# Patient Record
Sex: Male | Born: 1940 | Race: White | Hispanic: No | Marital: Married | Smoking: Former smoker
Health system: Southern US, Community
[De-identification: ages and names within clinical notes are randomized; demographics above are authoritative.]

## PROBLEM LIST (undated history)

## (undated) DIAGNOSIS — N401 Enlarged prostate with lower urinary tract symptoms: Secondary | ICD-10-CM

## (undated) DIAGNOSIS — N138 Other obstructive and reflux uropathy: Secondary | ICD-10-CM

## (undated) DIAGNOSIS — C801 Malignant (primary) neoplasm, unspecified: Secondary | ICD-10-CM

## (undated) DIAGNOSIS — C44319 Basal cell carcinoma of skin of other parts of face: Secondary | ICD-10-CM

## (undated) DIAGNOSIS — C8590 Non-Hodgkin lymphoma, unspecified, unspecified site: Secondary | ICD-10-CM

## (undated) DIAGNOSIS — E7800 Pure hypercholesterolemia, unspecified: Secondary | ICD-10-CM

## (undated) DIAGNOSIS — D126 Benign neoplasm of colon, unspecified: Secondary | ICD-10-CM

## (undated) DIAGNOSIS — J309 Allergic rhinitis, unspecified: Secondary | ICD-10-CM

## (undated) DIAGNOSIS — D171 Benign lipomatous neoplasm of skin and subcutaneous tissue of trunk: Secondary | ICD-10-CM

## (undated) DIAGNOSIS — G4733 Obstructive sleep apnea (adult) (pediatric): Secondary | ICD-10-CM

## (undated) DIAGNOSIS — D72820 Lymphocytosis (symptomatic): Secondary | ICD-10-CM

## (undated) HISTORY — DX: Pure hypercholesterolemia, unspecified: E78.00

## (undated) HISTORY — PX: OTHER SURGICAL HISTORY: SHX169

## (undated) HISTORY — DX: Obstructive sleep apnea (adult) (pediatric): G47.33

## (undated) HISTORY — DX: Benign prostatic hyperplasia with lower urinary tract symptoms: N40.1

## (undated) HISTORY — PX: SKIN SURGERY: SHX2413

## (undated) HISTORY — PX: HERNIA REPAIR: SHX51

## (undated) HISTORY — DX: Malignant (primary) neoplasm, unspecified: C80.1

## (undated) HISTORY — DX: Benign lipomatous neoplasm of skin and subcutaneous tissue of trunk: D17.1

## (undated) HISTORY — DX: Benign neoplasm of colon, unspecified: D12.6

## (undated) HISTORY — DX: Lymphocytosis (symptomatic): D72.820

## (undated) HISTORY — PX: HEMORRHOID SURGERY: SHX153

## (undated) HISTORY — DX: Basal cell carcinoma of skin of other parts of face: C44.319

## (undated) HISTORY — DX: Allergic rhinitis, unspecified: J30.9

## (undated) HISTORY — DX: Other obstructive and reflux uropathy: N13.8

---

## 1993-04-06 DIAGNOSIS — C801 Malignant (primary) neoplasm, unspecified: Secondary | ICD-10-CM

## 1993-04-06 HISTORY — DX: Malignant (primary) neoplasm, unspecified: C80.1

## 2000-09-08 ENCOUNTER — Ambulatory Visit (HOSPITAL_COMMUNITY): Admission: RE | Admit: 2000-09-08 | Discharge: 2000-09-08 | Payer: Self-pay | Admitting: Gastroenterology

## 2005-04-10 ENCOUNTER — Encounter: Admission: RE | Admit: 2005-04-10 | Discharge: 2005-04-10 | Payer: Self-pay | Admitting: General Surgery

## 2010-09-30 ENCOUNTER — Ambulatory Visit
Admission: RE | Admit: 2010-09-30 | Discharge: 2010-09-30 | Disposition: A | Discharge status: Home / Self Care | Payer: Medicare Other | Source: Ambulatory Visit | Attending: General Surgery | Admitting: General Surgery

## 2010-09-30 ENCOUNTER — Other Ambulatory Visit (INDEPENDENT_AMBULATORY_CARE_PROVIDER_SITE_OTHER): Payer: Self-pay | Admitting: General Surgery

## 2010-09-30 DIAGNOSIS — Z01811 Encounter for preprocedural respiratory examination: Secondary | ICD-10-CM

## 2010-10-03 DIAGNOSIS — K649 Unspecified hemorrhoids: Secondary | ICD-10-CM

## 2010-10-13 ENCOUNTER — Encounter (INDEPENDENT_AMBULATORY_CARE_PROVIDER_SITE_OTHER): Payer: Self-pay | Admitting: General Surgery

## 2010-10-15 ENCOUNTER — Encounter (INDEPENDENT_AMBULATORY_CARE_PROVIDER_SITE_OTHER): Payer: Self-pay | Admitting: General Surgery

## 2010-10-15 ENCOUNTER — Ambulatory Visit (INDEPENDENT_AMBULATORY_CARE_PROVIDER_SITE_OTHER): Payer: Medicare Other | Admitting: General Surgery

## 2010-10-15 DIAGNOSIS — K648 Other hemorrhoids: Secondary | ICD-10-CM | POA: Insufficient documentation

## 2010-10-15 NOTE — Progress Notes (Signed)
He is here for his first postoperative visit after hemorrhoidectomy. He is doing fairly well now. Does have some drainage. Bowels are moving well.  PE: Wound is open in the perianal area but it is clean.  Assessment: Doing well after hemorrhoidectomy.  Plan continue warm water soaks. Activities as tolerated. See him back in 6 weeks.

## 2011-03-19 ENCOUNTER — Encounter (INDEPENDENT_AMBULATORY_CARE_PROVIDER_SITE_OTHER): Payer: Self-pay | Admitting: General Surgery

## 2011-04-07 DIAGNOSIS — D126 Benign neoplasm of colon, unspecified: Secondary | ICD-10-CM

## 2011-04-07 HISTORY — DX: Benign neoplasm of colon, unspecified: D12.6

## 2011-04-15 ENCOUNTER — Encounter (INDEPENDENT_AMBULATORY_CARE_PROVIDER_SITE_OTHER): Payer: Self-pay | Admitting: General Surgery

## 2011-04-15 ENCOUNTER — Ambulatory Visit (INDEPENDENT_AMBULATORY_CARE_PROVIDER_SITE_OTHER): Payer: Medicare Other | Admitting: General Surgery

## 2011-04-15 VITALS — BP 144/86 | HR 67 | Temp 98.8°F | Ht 70.0 in | Wt 205.2 lb

## 2011-04-15 DIAGNOSIS — Z09 Encounter for follow-up examination after completed treatment for conditions other than malignant neoplasm: Secondary | ICD-10-CM | POA: Diagnosis not present

## 2011-04-15 DIAGNOSIS — K648 Other hemorrhoids: Secondary | ICD-10-CM | POA: Diagnosis not present

## 2011-04-15 NOTE — Patient Instructions (Signed)
Eat 30 grams of fiber per day.  Avoid constipation.

## 2011-04-15 NOTE — Progress Notes (Signed)
Kyle Sawyer is here for long-term follow up after his internal hemorrhoidectomy in June of 2012. He states she's had no problems with constipation or bleeding.  No anal pain.    Physical exam-anal rectal: External skin tags are noted, no fissures, no masses or blood on digital rectal exam.  Anoscopy-no hemorrhoidal disease right anterior area. Small internal hemorrhoids in the right posterior and left lateral position.  Assessment: Satisfactory result following right anterior internal hemorrhoidectomy for prolapsing internal hemorrhoid.  Plan: He can proceed with his colonoscopy. We discussed increasing the fiber in his diet and avoiding constipation. Return visit p.r.n.

## 2011-05-05 DIAGNOSIS — Z1211 Encounter for screening for malignant neoplasm of colon: Secondary | ICD-10-CM | POA: Diagnosis not present

## 2011-05-05 DIAGNOSIS — D126 Benign neoplasm of colon, unspecified: Secondary | ICD-10-CM | POA: Diagnosis not present

## 2011-07-24 DIAGNOSIS — H6120 Impacted cerumen, unspecified ear: Secondary | ICD-10-CM | POA: Diagnosis not present

## 2011-07-24 DIAGNOSIS — E7800 Pure hypercholesterolemia, unspecified: Secondary | ICD-10-CM | POA: Diagnosis not present

## 2011-07-24 DIAGNOSIS — Z1331 Encounter for screening for depression: Secondary | ICD-10-CM | POA: Diagnosis not present

## 2011-07-24 DIAGNOSIS — Z79899 Other long term (current) drug therapy: Secondary | ICD-10-CM | POA: Diagnosis not present

## 2011-07-24 DIAGNOSIS — Z0000 Encounter for general adult medical examination without abnormal findings: Secondary | ICD-10-CM | POA: Diagnosis not present

## 2011-12-31 DIAGNOSIS — Z23 Encounter for immunization: Secondary | ICD-10-CM | POA: Diagnosis not present

## 2012-01-18 DIAGNOSIS — Z85828 Personal history of other malignant neoplasm of skin: Secondary | ICD-10-CM | POA: Diagnosis not present

## 2012-01-18 DIAGNOSIS — L570 Actinic keratosis: Secondary | ICD-10-CM | POA: Diagnosis not present

## 2012-01-18 DIAGNOSIS — L821 Other seborrheic keratosis: Secondary | ICD-10-CM | POA: Diagnosis not present

## 2012-01-18 DIAGNOSIS — D239 Other benign neoplasm of skin, unspecified: Secondary | ICD-10-CM | POA: Diagnosis not present

## 2012-01-18 DIAGNOSIS — D485 Neoplasm of uncertain behavior of skin: Secondary | ICD-10-CM | POA: Diagnosis not present

## 2012-01-18 DIAGNOSIS — Z85820 Personal history of malignant melanoma of skin: Secondary | ICD-10-CM | POA: Diagnosis not present

## 2012-01-18 DIAGNOSIS — C44390 Other specified malignant neoplasm of skin of unspecified parts of face: Secondary | ICD-10-CM | POA: Diagnosis not present

## 2012-02-02 DIAGNOSIS — Z79899 Other long term (current) drug therapy: Secondary | ICD-10-CM | POA: Diagnosis not present

## 2012-02-15 DIAGNOSIS — C44319 Basal cell carcinoma of skin of other parts of face: Secondary | ICD-10-CM | POA: Diagnosis not present

## 2012-05-10 DIAGNOSIS — L570 Actinic keratosis: Secondary | ICD-10-CM | POA: Diagnosis not present

## 2012-07-13 DIAGNOSIS — H2510 Age-related nuclear cataract, unspecified eye: Secondary | ICD-10-CM | POA: Diagnosis not present

## 2012-07-26 DIAGNOSIS — Z0000 Encounter for general adult medical examination without abnormal findings: Secondary | ICD-10-CM | POA: Diagnosis not present

## 2012-07-26 DIAGNOSIS — Z1331 Encounter for screening for depression: Secondary | ICD-10-CM | POA: Diagnosis not present

## 2012-07-26 DIAGNOSIS — Z79899 Other long term (current) drug therapy: Secondary | ICD-10-CM | POA: Diagnosis not present

## 2012-07-26 DIAGNOSIS — R030 Elevated blood-pressure reading, without diagnosis of hypertension: Secondary | ICD-10-CM | POA: Diagnosis not present

## 2012-07-26 DIAGNOSIS — E7800 Pure hypercholesterolemia, unspecified: Secondary | ICD-10-CM | POA: Diagnosis not present

## 2012-11-14 DIAGNOSIS — R030 Elevated blood-pressure reading, without diagnosis of hypertension: Secondary | ICD-10-CM | POA: Diagnosis not present

## 2012-11-14 DIAGNOSIS — J301 Allergic rhinitis due to pollen: Secondary | ICD-10-CM | POA: Diagnosis not present

## 2012-12-14 DIAGNOSIS — Z23 Encounter for immunization: Secondary | ICD-10-CM | POA: Diagnosis not present

## 2013-01-23 DIAGNOSIS — Z79899 Other long term (current) drug therapy: Secondary | ICD-10-CM | POA: Diagnosis not present

## 2013-01-31 DIAGNOSIS — L821 Other seborrheic keratosis: Secondary | ICD-10-CM | POA: Diagnosis not present

## 2013-01-31 DIAGNOSIS — Z85820 Personal history of malignant melanoma of skin: Secondary | ICD-10-CM | POA: Diagnosis not present

## 2013-01-31 DIAGNOSIS — L570 Actinic keratosis: Secondary | ICD-10-CM | POA: Diagnosis not present

## 2013-01-31 DIAGNOSIS — D239 Other benign neoplasm of skin, unspecified: Secondary | ICD-10-CM | POA: Diagnosis not present

## 2013-01-31 DIAGNOSIS — Z85828 Personal history of other malignant neoplasm of skin: Secondary | ICD-10-CM | POA: Diagnosis not present

## 2013-02-13 DIAGNOSIS — D696 Thrombocytopenia, unspecified: Secondary | ICD-10-CM | POA: Diagnosis not present

## 2013-02-13 DIAGNOSIS — Z79899 Other long term (current) drug therapy: Secondary | ICD-10-CM | POA: Diagnosis not present

## 2013-03-20 DIAGNOSIS — J40 Bronchitis, not specified as acute or chronic: Secondary | ICD-10-CM | POA: Diagnosis not present

## 2013-07-27 DIAGNOSIS — Z23 Encounter for immunization: Secondary | ICD-10-CM | POA: Diagnosis not present

## 2013-07-27 DIAGNOSIS — D696 Thrombocytopenia, unspecified: Secondary | ICD-10-CM | POA: Diagnosis not present

## 2013-07-27 DIAGNOSIS — Z1331 Encounter for screening for depression: Secondary | ICD-10-CM | POA: Diagnosis not present

## 2013-07-27 DIAGNOSIS — Z0000 Encounter for general adult medical examination without abnormal findings: Secondary | ICD-10-CM | POA: Diagnosis not present

## 2013-07-27 DIAGNOSIS — Z131 Encounter for screening for diabetes mellitus: Secondary | ICD-10-CM | POA: Diagnosis not present

## 2013-12-27 DIAGNOSIS — Z23 Encounter for immunization: Secondary | ICD-10-CM | POA: Diagnosis not present

## 2014-02-05 DIAGNOSIS — Z85828 Personal history of other malignant neoplasm of skin: Secondary | ICD-10-CM | POA: Diagnosis not present

## 2014-02-05 DIAGNOSIS — D239 Other benign neoplasm of skin, unspecified: Secondary | ICD-10-CM | POA: Diagnosis not present

## 2014-02-05 DIAGNOSIS — D0339 Melanoma in situ of other parts of face: Secondary | ICD-10-CM | POA: Diagnosis not present

## 2014-02-05 DIAGNOSIS — Z87898 Personal history of other specified conditions: Secondary | ICD-10-CM | POA: Diagnosis not present

## 2014-02-05 DIAGNOSIS — L821 Other seborrheic keratosis: Secondary | ICD-10-CM | POA: Diagnosis not present

## 2014-02-05 DIAGNOSIS — L570 Actinic keratosis: Secondary | ICD-10-CM | POA: Diagnosis not present

## 2014-05-09 DIAGNOSIS — L720 Epidermal cyst: Secondary | ICD-10-CM | POA: Diagnosis not present

## 2014-05-09 DIAGNOSIS — L821 Other seborrheic keratosis: Secondary | ICD-10-CM | POA: Diagnosis not present

## 2014-05-09 DIAGNOSIS — L570 Actinic keratosis: Secondary | ICD-10-CM | POA: Diagnosis not present

## 2014-06-11 DIAGNOSIS — J069 Acute upper respiratory infection, unspecified: Secondary | ICD-10-CM | POA: Diagnosis not present

## 2014-08-02 DIAGNOSIS — D696 Thrombocytopenia, unspecified: Secondary | ICD-10-CM | POA: Diagnosis not present

## 2014-08-02 DIAGNOSIS — Z1389 Encounter for screening for other disorder: Secondary | ICD-10-CM | POA: Diagnosis not present

## 2014-08-02 DIAGNOSIS — Z0000 Encounter for general adult medical examination without abnormal findings: Secondary | ICD-10-CM | POA: Diagnosis not present

## 2014-08-02 DIAGNOSIS — E780 Pure hypercholesterolemia: Secondary | ICD-10-CM | POA: Diagnosis not present

## 2014-08-02 DIAGNOSIS — H6123 Impacted cerumen, bilateral: Secondary | ICD-10-CM | POA: Diagnosis not present

## 2014-08-02 DIAGNOSIS — H6121 Impacted cerumen, right ear: Secondary | ICD-10-CM | POA: Diagnosis not present

## 2014-08-02 DIAGNOSIS — Z131 Encounter for screening for diabetes mellitus: Secondary | ICD-10-CM | POA: Diagnosis not present

## 2014-08-02 DIAGNOSIS — J309 Allergic rhinitis, unspecified: Secondary | ICD-10-CM | POA: Diagnosis not present

## 2014-10-01 DIAGNOSIS — H6690 Otitis media, unspecified, unspecified ear: Secondary | ICD-10-CM | POA: Diagnosis not present

## 2014-10-01 DIAGNOSIS — H6122 Impacted cerumen, left ear: Secondary | ICD-10-CM | POA: Diagnosis not present

## 2014-11-06 DIAGNOSIS — H2513 Age-related nuclear cataract, bilateral: Secondary | ICD-10-CM | POA: Diagnosis not present

## 2014-11-06 DIAGNOSIS — H5213 Myopia, bilateral: Secondary | ICD-10-CM | POA: Diagnosis not present

## 2014-11-06 DIAGNOSIS — H43813 Vitreous degeneration, bilateral: Secondary | ICD-10-CM | POA: Diagnosis not present

## 2015-01-24 DIAGNOSIS — Z23 Encounter for immunization: Secondary | ICD-10-CM | POA: Diagnosis not present

## 2015-03-04 DIAGNOSIS — L821 Other seborrheic keratosis: Secondary | ICD-10-CM | POA: Diagnosis not present

## 2015-03-04 DIAGNOSIS — Z85831 Personal history of malignant neoplasm of soft tissue: Secondary | ICD-10-CM | POA: Diagnosis not present

## 2015-03-04 DIAGNOSIS — D225 Melanocytic nevi of trunk: Secondary | ICD-10-CM | POA: Diagnosis not present

## 2015-03-04 DIAGNOSIS — Z85828 Personal history of other malignant neoplasm of skin: Secondary | ICD-10-CM | POA: Diagnosis not present

## 2015-03-04 DIAGNOSIS — Z87898 Personal history of other specified conditions: Secondary | ICD-10-CM | POA: Diagnosis not present

## 2015-03-04 DIAGNOSIS — Z411 Encounter for cosmetic surgery: Secondary | ICD-10-CM | POA: Diagnosis not present

## 2015-03-04 DIAGNOSIS — L570 Actinic keratosis: Secondary | ICD-10-CM | POA: Diagnosis not present

## 2015-03-04 DIAGNOSIS — L281 Prurigo nodularis: Secondary | ICD-10-CM | POA: Diagnosis not present

## 2015-07-02 DIAGNOSIS — M25473 Effusion, unspecified ankle: Secondary | ICD-10-CM | POA: Diagnosis not present

## 2015-07-02 DIAGNOSIS — J019 Acute sinusitis, unspecified: Secondary | ICD-10-CM | POA: Diagnosis not present

## 2015-07-02 DIAGNOSIS — B353 Tinea pedis: Secondary | ICD-10-CM | POA: Diagnosis not present

## 2015-08-14 DIAGNOSIS — Z7189 Other specified counseling: Secondary | ICD-10-CM | POA: Diagnosis not present

## 2015-08-14 DIAGNOSIS — Z1389 Encounter for screening for other disorder: Secondary | ICD-10-CM | POA: Diagnosis not present

## 2015-08-14 DIAGNOSIS — M79672 Pain in left foot: Secondary | ICD-10-CM | POA: Diagnosis not present

## 2015-08-14 DIAGNOSIS — Z0000 Encounter for general adult medical examination without abnormal findings: Secondary | ICD-10-CM | POA: Diagnosis not present

## 2015-08-14 DIAGNOSIS — D696 Thrombocytopenia, unspecified: Secondary | ICD-10-CM | POA: Diagnosis not present

## 2015-08-14 DIAGNOSIS — M79671 Pain in right foot: Secondary | ICD-10-CM | POA: Diagnosis not present

## 2015-08-14 DIAGNOSIS — E782 Mixed hyperlipidemia: Secondary | ICD-10-CM | POA: Diagnosis not present

## 2015-08-14 DIAGNOSIS — D72820 Lymphocytosis (symptomatic): Secondary | ICD-10-CM | POA: Diagnosis not present

## 2015-09-11 ENCOUNTER — Other Ambulatory Visit: Payer: Self-pay | Admitting: Internal Medicine

## 2015-09-11 ENCOUNTER — Ambulatory Visit
Admission: RE | Admit: 2015-09-11 | Discharge: 2015-09-11 | Disposition: A | Discharge status: Home / Self Care | Payer: Medicare Other | Source: Ambulatory Visit | Attending: Internal Medicine | Admitting: Internal Medicine

## 2015-09-11 DIAGNOSIS — R591 Generalized enlarged lymph nodes: Secondary | ICD-10-CM | POA: Diagnosis not present

## 2015-09-11 DIAGNOSIS — R599 Enlarged lymph nodes, unspecified: Secondary | ICD-10-CM

## 2015-09-11 DIAGNOSIS — D72820 Lymphocytosis (symptomatic): Secondary | ICD-10-CM | POA: Diagnosis not present

## 2015-09-11 DIAGNOSIS — R918 Other nonspecific abnormal finding of lung field: Secondary | ICD-10-CM | POA: Diagnosis not present

## 2015-09-18 ENCOUNTER — Ambulatory Visit
Admission: RE | Admit: 2015-09-18 | Discharge: 2015-09-18 | Disposition: A | Discharge status: Home / Self Care | Payer: Medicare Other | Source: Ambulatory Visit | Attending: Internal Medicine | Admitting: Internal Medicine

## 2015-09-18 DIAGNOSIS — R590 Localized enlarged lymph nodes: Secondary | ICD-10-CM | POA: Diagnosis not present

## 2015-09-18 DIAGNOSIS — R591 Generalized enlarged lymph nodes: Secondary | ICD-10-CM

## 2015-09-18 DIAGNOSIS — R599 Enlarged lymph nodes, unspecified: Secondary | ICD-10-CM

## 2015-09-18 MED ORDER — IOPAMIDOL (ISOVUE-300) INJECTION 61%
75.0000 mL | Freq: Once | INTRAVENOUS | Status: AC | PRN
  Administered 2015-09-18: 75 mL via INTRAVENOUS

## 2015-10-17 DIAGNOSIS — K118 Other diseases of salivary glands: Secondary | ICD-10-CM | POA: Diagnosis not present

## 2015-12-31 DIAGNOSIS — Z23 Encounter for immunization: Secondary | ICD-10-CM | POA: Diagnosis not present

## 2016-02-20 DIAGNOSIS — L821 Other seborrheic keratosis: Secondary | ICD-10-CM | POA: Diagnosis not present

## 2016-02-20 DIAGNOSIS — D485 Neoplasm of uncertain behavior of skin: Secondary | ICD-10-CM | POA: Diagnosis not present

## 2016-02-20 DIAGNOSIS — Z87898 Personal history of other specified conditions: Secondary | ICD-10-CM | POA: Diagnosis not present

## 2016-02-20 DIAGNOSIS — L814 Other melanin hyperpigmentation: Secondary | ICD-10-CM | POA: Diagnosis not present

## 2016-02-20 DIAGNOSIS — L570 Actinic keratosis: Secondary | ICD-10-CM | POA: Diagnosis not present

## 2016-02-20 DIAGNOSIS — Z85831 Personal history of malignant neoplasm of soft tissue: Secondary | ICD-10-CM | POA: Diagnosis not present

## 2016-02-20 DIAGNOSIS — D1800 Hemangioma unspecified site: Secondary | ICD-10-CM | POA: Diagnosis not present

## 2016-02-20 DIAGNOSIS — D225 Melanocytic nevi of trunk: Secondary | ICD-10-CM | POA: Diagnosis not present

## 2016-02-20 DIAGNOSIS — Z85828 Personal history of other malignant neoplasm of skin: Secondary | ICD-10-CM | POA: Diagnosis not present

## 2016-02-21 DIAGNOSIS — C44319 Basal cell carcinoma of skin of other parts of face: Secondary | ICD-10-CM | POA: Diagnosis not present

## 2016-04-20 DIAGNOSIS — C44319 Basal cell carcinoma of skin of other parts of face: Secondary | ICD-10-CM | POA: Diagnosis not present

## 2016-08-14 ENCOUNTER — Other Ambulatory Visit: Payer: Self-pay | Admitting: Geriatric Medicine

## 2016-08-14 DIAGNOSIS — R229 Localized swelling, mass and lump, unspecified: Secondary | ICD-10-CM | POA: Diagnosis not present

## 2016-08-14 DIAGNOSIS — Z0000 Encounter for general adult medical examination without abnormal findings: Secondary | ICD-10-CM | POA: Diagnosis not present

## 2016-08-14 DIAGNOSIS — D696 Thrombocytopenia, unspecified: Secondary | ICD-10-CM | POA: Diagnosis not present

## 2016-08-14 DIAGNOSIS — R3911 Hesitancy of micturition: Secondary | ICD-10-CM | POA: Diagnosis not present

## 2016-08-14 DIAGNOSIS — Z79899 Other long term (current) drug therapy: Secondary | ICD-10-CM | POA: Diagnosis not present

## 2016-08-14 DIAGNOSIS — E782 Mixed hyperlipidemia: Secondary | ICD-10-CM | POA: Diagnosis not present

## 2016-08-14 DIAGNOSIS — D72820 Lymphocytosis (symptomatic): Secondary | ICD-10-CM | POA: Insufficient documentation

## 2016-08-14 DIAGNOSIS — Z1211 Encounter for screening for malignant neoplasm of colon: Secondary | ICD-10-CM | POA: Diagnosis not present

## 2016-08-14 DIAGNOSIS — D369 Benign neoplasm, unspecified site: Secondary | ICD-10-CM | POA: Diagnosis not present

## 2016-08-14 DIAGNOSIS — Z1389 Encounter for screening for other disorder: Secondary | ICD-10-CM | POA: Diagnosis not present

## 2016-08-14 DIAGNOSIS — IMO0002 Reserved for concepts with insufficient information to code with codable children: Secondary | ICD-10-CM

## 2016-08-14 DIAGNOSIS — N401 Enlarged prostate with lower urinary tract symptoms: Secondary | ICD-10-CM | POA: Diagnosis not present

## 2016-08-14 DIAGNOSIS — K4090 Unilateral inguinal hernia, without obstruction or gangrene, not specified as recurrent: Secondary | ICD-10-CM | POA: Diagnosis not present

## 2016-08-14 DIAGNOSIS — N138 Other obstructive and reflux uropathy: Secondary | ICD-10-CM

## 2016-08-14 HISTORY — DX: Lymphocytosis (symptomatic): D72.820

## 2016-08-14 HISTORY — DX: Other obstructive and reflux uropathy: N13.8

## 2016-08-18 ENCOUNTER — Ambulatory Visit
Admission: RE | Admit: 2016-08-18 | Discharge: 2016-08-18 | Disposition: A | Discharge status: Home / Self Care | Payer: Medicare Other | Source: Ambulatory Visit | Attending: Geriatric Medicine | Admitting: Geriatric Medicine

## 2016-08-18 DIAGNOSIS — R229 Localized swelling, mass and lump, unspecified: Principal | ICD-10-CM

## 2016-08-18 DIAGNOSIS — IMO0002 Reserved for concepts with insufficient information to code with codable children: Secondary | ICD-10-CM

## 2016-08-18 DIAGNOSIS — D1721 Benign lipomatous neoplasm of skin and subcutaneous tissue of right arm: Secondary | ICD-10-CM | POA: Diagnosis not present

## 2016-08-18 MED ORDER — GADOBENATE DIMEGLUMINE 529 MG/ML IV SOLN
18.0000 mL | Freq: Once | INTRAVENOUS | Status: AC | PRN
  Administered 2016-08-18: 18 mL via INTRAVENOUS

## 2016-08-20 DIAGNOSIS — R809 Proteinuria, unspecified: Secondary | ICD-10-CM | POA: Diagnosis not present

## 2016-08-28 ENCOUNTER — Telehealth: Payer: Self-pay | Admitting: *Deleted

## 2016-08-28 NOTE — Telephone Encounter (Signed)
Received a call from [place] at [facility] wanting to know the status of a new appt for this patient. Call back # is [phone].

## 2016-09-09 ENCOUNTER — Telehealth: Payer: Self-pay | Admitting: Internal Medicine

## 2016-09-09 ENCOUNTER — Encounter: Payer: Self-pay | Admitting: Hematology and Oncology

## 2016-09-09 DIAGNOSIS — K4090 Unilateral inguinal hernia, without obstruction or gangrene, not specified as recurrent: Secondary | ICD-10-CM | POA: Diagnosis not present

## 2016-09-09 NOTE — Telephone Encounter (Signed)
Was able to move the pt's hem appt to an earlier date w/Dr. Lebron Conners. Pt has been scheduled for 6/8 at 9am. Aware to arrive 15-30 minutes early. Demographics verified. Letter faxed to the referring office.

## 2016-09-10 ENCOUNTER — Encounter: Payer: Self-pay | Admitting: Hematology and Oncology

## 2016-09-10 NOTE — Assessment & Plan Note (Addendum)
76 year old male with progressive mild lymphocytic leukocytosis compared to 3 years ago. Associated slight drop in hemoglobin without frank anemia and no thrombocytopenia. 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 This usually is an indolent lymphoproliferative process, but may be associated with paraneoplastic syndromes such as anemia, thrombocytopenia, immune deficiency, and others. Clinical history does not reveal any evidence SUCH a state at this time. Patient does not have any history of recurrent and persistent infections. Confirmatory studies are required and are outlined below. Due to low-level white blood cell count and no significant anemia or thrombocytopenia, we will forego bone marrow biopsy at this time. Biopsy may be required if hematological profile changes, or treatment is contemplated for another reason.  Once the are completed, we will have patient return to our clinic for review of the findings. Of note, patient is planned for possible right inguinal hernia repair. Even if chronic lymphocytic leukemia is discovered, condition does not pose a significant increase in surgical risk. It is not known to be associated, with excessive bleeding or infectious complications in the absence of notable coagulopathy or previous established immune deficiency. Following a surgery, lymphocyte count may elevate above the current baseline, but that would not constitute disease progression, but a reactive change to the surgical intervention.

## 2016-09-10 NOTE — Progress Notes (Signed)
[name]:  Assessment: CLL (chronic lymphocytic leukemia) ([name]) 76 year old male with progressive mild lymphocytic leukocytosis compared to 3 years ago. Associated slight drop in hemoglobin without frank anemia and no thrombocytopenia. 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 This usually is an indolent lymphoproliferative process, but may be associated with paraneoplastic syndromes such as anemia, thrombocytopenia, immune deficiency, and others. Clinical history does not reveal any evidence SUCH a state at this time. Patient does not have any history of recurrent and persistent infections. Confirmatory studies are required and are outlined below. Due to low-level white blood cell count and no significant anemia or thrombocytopenia, we will forego bone marrow biopsy at this time. Biopsy may be required if hematological profile changes, or treatment is contemplated for another reason.  Once the are completed, we will have patient return to our clinic for review of the findings. Of note, patient is planned for possible right inguinal hernia repair. Even if chronic lymphocytic leukemia is discovered, condition does not pose a significant increase in surgical risk. It is not known to be associated, with excessive bleeding or infectious complications in the absence of notable coagulopathy or previous established immune deficiency. Following a surgery, lymphocyte count may elevate above the current baseline, but that would not constitute disease progression, but a reactive change to the surgical intervention.     Orders Placed This Encounter  Procedures  . CT Soft Tissue Neck W Contrast    Standing Status:   Future    Standing Expiration Date:   09/10/2017    Order Specific Question:   If indicated for the ordered procedure, I authorize the administration of contrast media per Radiology protocol    Answer:   Yes    Order Specific  Question:   Reason for Exam (SYMPTOM  OR DIAGNOSIS REQUIRED)    Answer:   Diagnosis chronic lymphocytic leukemia, evaluation for lymphadenopathy    Order Specific Question:   Preferred imaging location?    Answer:   [facility]    Order Specific Question:   Radiology Contrast Protocol - do NOT remove file path    Answer:   \\[email or web address].pdf  . CT Chest W Contrast    Standing Status:   Future    Standing Expiration Date:   09/10/2017    Order Specific Question:   If indicated for the ordered procedure, I authorize the administration of contrast media per Radiology protocol    Answer:   Yes    Order Specific Question:   Reason for Exam (SYMPTOM  OR DIAGNOSIS REQUIRED)    Answer:   Diagnosis chronic lymphocytic leukemia, evaluation for lymphadenopathy    Order Specific Question:   Preferred imaging location?    Answer:   [facility]    Order Specific Question:   Radiology Contrast Protocol - do NOT remove file path    Answer:   \\[email or web address]  . CT Abdomen Pelvis W Wo Contrast    This exam should ONLY be ordered for initial diagnosis or follow up of known pancreatic/liver/renal/bladder masses.    Standing Status:   Future    Standing Expiration Date:   12/11/2017    Order Specific Question:   If indicated for the ordered procedure, I authorize the administration of contrast media per Radiology protocol    Answer:   Yes    Order Specific Question:   Reason for Exam (SYMPTOM  OR DIAGNOSIS REQUIRED)    Answer:   Diagnosis chronic lymphocytic leukemia,  evaluation for lymphadenopathy    Order Specific Question:   Preferred imaging location?    Answer:   [facility]    Order Specific Question:   Radiology Contrast Protocol - do NOT remove file path    Answer:   \\[email or web address]  . CBC & Diff and Retic    Standing Status:   Future    Standing Expiration Date:   09/10/2017  . Comprehensive  metabolic panel    Standing Status:   Future    Standing Expiration Date:   09/10/2017  . Lactate dehydrogenase (LDH)    Standing Status:   Future    Standing Expiration Date:   09/10/2017  . Beta 2 microglobulin    Standing Status:   Future    Standing Expiration Date:   09/10/2017  . Uric acid    Standing Status:   Future    Standing Expiration Date:   09/10/2017  . Pathologist smear review    Standing Status:   Future    Standing Expiration Date:   09/10/2017  . Haptoglobin    Standing Status:   Future    Standing Expiration Date:   09/10/2017  . QIG  (Quant. immunoglobulins  - IgG, IgA, IgM)    Standing Status:   Future    Standing Expiration Date:   09/10/2017  . Flow Cytometry    Standing Status:   Future    Standing Expiration Date:   09/10/2017  . FISH, CLL Prognostic Panel    Standing Status:   Future    Standing Expiration Date:   09/10/2017  . Cytogenetics, Peripheral Blood    Standing Status:   Future    Standing Expiration Date:   09/10/2017  . Direct antiglobulin test ([facility])    Standing Status:   Future    Standing Expiration Date:   09/10/2017    All questions were answered.  . The patient knows to call the clinic with any problems, questions or concerns.  This note was electronically signed.    History of Presenting Illness Kyle Sawyer 76 y.o. presenting to the [facility] for evaluation for lymphocytosis. Please see oncologic/hematologic history below for details.   Appears that the elevated white blood cell count was discovered last year and patient was referred to hospice this time due to persistence off lymphocytosis with progression of the elevation of the white count. At the time of discovery, patient was suffering from sinusitis which has promptly resolved. Previous evaluation revealed no signs of anemia, thrombocytopenia, or persistent infections.  Over the past year, patient denies any fever, chills, night sweats. Denies and swelling in the neck, armpits, or  groin. Denies any mouth, skin rash, difficulty swallowing, nausea, diminished appetite, early satiety, abdominal pain, diarrhea, or constipation. No chest pain, shortness of breath, cough, palpitations, or swelling in the lower extremities. No new urinary or neurologic complaints.  Oncological/hematological History:  **CLL/SLL, Rai 0, evaluation pending  Clinical course: --External labs, 07/27/13: WBC 7.2, ALC 3.00, Hgb 14.7, Plt 144;  --External labs, 08/02/14: WBC 9.7, ALC 5.40, Hgb 14.2, Plt 162;  --External labs, 08/14/15: WBC 12.4, ALC 6.10, Hgb 13.9, Plt 180;  --External labs, 09/11/15: WBC 10.2, ALC 4.70, Hgb 13.6, Plt 161;  --CT Neck, 09/19/15: Obtained due to palpable neck swelling/lymphadenopathy. Report demonstrates no evidence of abnormal lymphadenopathy at the time, but prominent submandibular salivary glands. --External labs, 08/14/16: WBC 16.2, ALC 10.0, ANC 5.0, Mono 0.8, Eos 0.3, Baso 0.1, Hgb 13.7, Plt 187;   Treatment  history: Medical History: Past Medical History:  Diagnosis Date  . Adenomatous colon polyp 04/2011  . Allergic rhinitis   . Basal cell carcinoma (BCC) of forehead   . 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 Hemorrhoids   . Hypercholesterolemia   . Lipoma of back    Right scapula  . Lymphocytosis 08/14/2016  . Obstructive sleep apnea hypopnea, mild   . 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 HEMORRHOID SURGERY    . HERNIA REPAIR     LIH 2007  . SKIN SURGERY     various unspecified    Family History: History reviewed. No pertinent family history.  Social History: Social History   Social History  . Marital status: Married    Spouse name: N/A  . Number of children: N/A  . Years of education: N/A   Occupational History  . Retired [employer]   Social History Main Topics  . Smoking status: Current Every Day Smoker     Types: Pipe  . Smokeless tobacco: Never Used  . Alcohol use No  . Drug use: No  . Sexual activity: Not on file   Other Topics Concern  . Not on file   Social History Narrative  . No narrative on file    Allergies: No Known Allergies  Medications:  Current Outpatient Prescriptions  Medication Sig Dispense Refill  . aspirin 81 MG tablet Take 81 mg by mouth daily.      Marland Kitchen atorvastatin (LIPITOR) 10 MG tablet Take 10 mg by mouth daily.    Marland Kitchen GAVILYTE-N WITH FLAVOR PACK 420 g solution     . tamsulosin (FLOMAX) 0.4 MG CAPS capsule Take 1 capsule by mouth daily.     No current facility-administered medications for this visit.     Review of Systems: Review of Systems  All other systems reviewed and are negative.    PHYSICAL EXAMINATION Blood pressure (!) 110/58, pulse (!) 58, temperature 98.5 F (36.9 C), temperature source Oral, resp. rate 18, height 5' 10"  (1.778 m), weight 192 lb 11.2 oz (87.4 kg), SpO2 98 %.  ECOG PERFORMANCE STATUS: 0 - Asymptomatic  Physical Exam  Constitutional: He is oriented to person, place, and time and well-developed, well-nourished, and in no distress.  HENT:  Head: Normocephalic.  Mouth/Throat: Oropharynx is clear and moist. No oropharyngeal exudate.  Eyes: Conjunctivae are normal. Pupils are equal, round, and reactive to light. No scleral icterus.  Neck: No JVD present. No thyromegaly present.  Cardiovascular: Normal rate, normal heart sounds and intact distal pulses.  Exam reveals no gallop and no friction rub.   No murmur heard. Pulmonary/Chest: Breath sounds normal. No stridor. He has no wheezes. He has no rales.  Abdominal: Soft. He exhibits no distension and no mass. There is no tenderness. There is no rebound.  No hepatosplenomegaly by palpation or percussion  Lymphadenopathy:  No palpable lymphadenopathy in the cervical, supraclavicular, axillary, or inguinal areas  Neurological: He is alert and oriented to person, place, and time. He  displays normal reflexes. No cranial nerve deficit. He exhibits normal muscle tone.  Skin: Skin is warm. No rash noted. No erythema.     LABORATORY DATA:  CBC No results found for: WBC, RBC, HGB, HCT, PLT, MCV, MCH, MCHC, RDW, LYMPHSABS, MONOABS, EOSABS, BASOSABS   I have personally reviewed the data as listed: No visits with results within 1 Week(s) from this  visit.  Latest known visit with results is:  No results found for any previous visit.       Ardath Sax, MD

## 2016-09-11 ENCOUNTER — Encounter: Payer: Self-pay | Admitting: Hematology and Oncology

## 2016-09-11 ENCOUNTER — Other Ambulatory Visit (HOSPITAL_COMMUNITY)
Admission: RE | Admit: 2016-09-11 | Discharge: 2016-09-11 | Disposition: A | Discharge status: Home / Self Care | Payer: Medicare Other | Source: Ambulatory Visit | Attending: Hematology and Oncology | Admitting: Hematology and Oncology

## 2016-09-11 ENCOUNTER — Ambulatory Visit (HOSPITAL_BASED_OUTPATIENT_CLINIC_OR_DEPARTMENT_OTHER): Payer: Medicare Other | Admitting: Hematology and Oncology

## 2016-09-11 ENCOUNTER — Telehealth: Payer: Self-pay | Admitting: Hematology and Oncology

## 2016-09-11 ENCOUNTER — Ambulatory Visit (HOSPITAL_BASED_OUTPATIENT_CLINIC_OR_DEPARTMENT_OTHER): Payer: Medicare Other

## 2016-09-11 DIAGNOSIS — D72820 Lymphocytosis (symptomatic): Secondary | ICD-10-CM

## 2016-09-11 DIAGNOSIS — D8941 Monoclonal mast cell activation syndrome: Secondary | ICD-10-CM | POA: Diagnosis not present

## 2016-09-11 DIAGNOSIS — Z720 Tobacco use: Secondary | ICD-10-CM

## 2016-09-11 DIAGNOSIS — C9190 Lymphoid leukemia, unspecified not having achieved remission: Secondary | ICD-10-CM | POA: Diagnosis not present

## 2016-09-11 DIAGNOSIS — C9110 Chronic lymphocytic leukemia of B-cell type not having achieved remission: Secondary | ICD-10-CM

## 2016-09-11 LAB — CBC & DIFF AND RETIC
BASO%: 0.3 % (ref 0.0–2.0)
Basophils Absolute: 0 10*3/uL (ref 0.0–0.1)
EOS%: 2.8 % (ref 0.0–7.0)
Eosinophils Absolute: 0.3 10*3/uL (ref 0.0–0.5)
HCT: 40.7 % (ref 38.4–49.9)
HGB: 13.4 g/dL (ref 13.0–17.1)
Immature Retic Fract: 5.1 % (ref 3.00–10.60)
LYMPH%: 61.7 % — ABNORMAL HIGH (ref 14.0–49.0)
MCH: 30 pg (ref 27.2–33.4)
MCHC: 32.9 g/dL (ref 32.0–36.0)
MCV: 91.3 fL (ref 79.3–98.0)
MONO#: 0.4 10*3/uL (ref 0.1–0.9)
MONO%: 3 % (ref 0.0–14.0)
NEUT#: 3.8 10*3/uL (ref 1.5–6.5)
NEUT%: 32.2 % — ABNORMAL LOW (ref 39.0–75.0)
Platelets: 156 10*3/uL (ref 140–400)
RBC: 4.46 10*6/uL (ref 4.20–5.82)
RDW: 14.3 % (ref 11.0–14.6)
Retic %: 1.35 % (ref 0.80–1.80)
Retic Ct Abs: 60.21 10*3/uL (ref 34.80–93.90)
WBC: 11.9 10*3/uL — ABNORMAL HIGH (ref 4.0–10.3)
lymph#: 7.3 10*3/uL — ABNORMAL HIGH (ref 0.9–3.3)

## 2016-09-11 LAB — COMPREHENSIVE METABOLIC PANEL
ALT: 20 U/L (ref 0–55)
AST: 23 U/L (ref 5–34)
Albumin: 3.7 g/dL (ref 3.5–5.0)
Alkaline Phosphatase: 76 U/L (ref 40–150)
Anion Gap: 6 mEq/L (ref 3–11)
BUN: 15.1 mg/dL (ref 7.0–26.0)
CO2: 27 mEq/L (ref 22–29)
Calcium: 9.4 mg/dL (ref 8.4–10.4)
Chloride: 106 mEq/L (ref 98–109)
Creatinine: 1 mg/dL (ref 0.7–1.3)
EGFR: 73 mL/min/{1.73_m2} — ABNORMAL LOW (ref >90)
Glucose: 73 mg/dl (ref 70–140)
Potassium: 4.1 mEq/L (ref 3.5–5.1)
Sodium: 140 mEq/L (ref 136–145)
Total Bilirubin: 0.65 mg/dL (ref 0.20–1.20)
Total Protein: 6.2 g/dL — ABNORMAL LOW (ref 6.4–8.3)

## 2016-09-11 LAB — LACTATE DEHYDROGENASE: LDH: 176 U/L (ref 125–245)

## 2016-09-11 LAB — URIC ACID: Uric Acid, Serum: 6.7 mg/dl (ref 2.6–7.4)

## 2016-09-11 LAB — TECHNOLOGIST REVIEW

## 2016-09-11 NOTE — Telephone Encounter (Signed)
Appointment scheduled and confirmed with patient, per 09/11/16 los.

## 2016-09-11 NOTE — Telephone Encounter (Signed)
Patient was given a copy of the aVS report and appointment schedule, per 09/11/16 los.

## 2016-09-12 LAB — IGG, IGA, IGM
IgA, Qn, Serum: 59 mg/dL — ABNORMAL LOW (ref 61–437)
IgG, Qn, Serum: 731 mg/dL (ref 700–1600)
IgM, Qn, Serum: 50 mg/dL (ref 15–143)

## 2016-09-12 LAB — HAPTOGLOBIN: Haptoglobin: 148 mg/dL (ref 34–200)

## 2016-09-12 LAB — BETA 2 MICROGLOBULIN, SERUM: Beta-2: 3.4 mg/L — ABNORMAL HIGH (ref 0.6–2.4)

## 2016-09-14 LAB — DIRECT ANTIGLOBULIN TEST (NOT AT ARMC): Coombs', Direct: NEGATIVE

## 2016-09-17 ENCOUNTER — Ambulatory Visit
Admission: RE | Admit: 2016-09-17 | Discharge: 2016-09-17 | Disposition: A | Discharge status: Home / Self Care | Payer: Medicare Other | Source: Ambulatory Visit | Attending: Hematology and Oncology | Admitting: Hematology and Oncology

## 2016-09-17 DIAGNOSIS — C9110 Chronic lymphocytic leukemia of B-cell type not having achieved remission: Secondary | ICD-10-CM

## 2016-09-17 DIAGNOSIS — C9111 Chronic lymphocytic leukemia of B-cell type in remission: Secondary | ICD-10-CM | POA: Diagnosis not present

## 2016-09-17 LAB — FLOW CYTOMETRY

## 2016-09-17 MED ORDER — IOPAMIDOL (ISOVUE-300) INJECTION 61%
150.0000 mL | Freq: Once | INTRAVENOUS | Status: AC | PRN
  Administered 2016-09-17: 150 mL via INTRAVENOUS

## 2016-09-18 LAB — CYTOGENETICS, PERIPHERAL BLOOD

## 2016-09-18 LAB — FISH, PERIPHERAL BLOOD

## 2016-09-24 ENCOUNTER — Encounter: Payer: Medicare Other | Admitting: Hematology

## 2016-09-24 DIAGNOSIS — Z86010 Personal history of colonic polyps: Secondary | ICD-10-CM | POA: Diagnosis not present

## 2016-09-24 DIAGNOSIS — K6289 Other specified diseases of anus and rectum: Secondary | ICD-10-CM | POA: Diagnosis not present

## 2016-09-24 DIAGNOSIS — K635 Polyp of colon: Secondary | ICD-10-CM | POA: Diagnosis not present

## 2016-09-24 DIAGNOSIS — D126 Benign neoplasm of colon, unspecified: Secondary | ICD-10-CM | POA: Diagnosis not present

## 2016-09-24 DIAGNOSIS — K648 Other hemorrhoids: Secondary | ICD-10-CM | POA: Diagnosis not present

## 2016-09-25 ENCOUNTER — Telehealth: Payer: Self-pay | Admitting: Hematology and Oncology

## 2016-09-25 ENCOUNTER — Ambulatory Visit (HOSPITAL_BASED_OUTPATIENT_CLINIC_OR_DEPARTMENT_OTHER): Payer: Medicare Other | Admitting: Hematology and Oncology

## 2016-09-25 ENCOUNTER — Encounter: Payer: Self-pay | Admitting: Hematology and Oncology

## 2016-09-25 ENCOUNTER — Other Ambulatory Visit: Payer: Self-pay | Admitting: *Deleted

## 2016-09-25 VITALS — BP 105/67 | HR 57 | Temp 98.4°F | Resp 17 | Ht 70.0 in | Wt 190.9 lb

## 2016-09-25 DIAGNOSIS — D72828 Other elevated white blood cell count: Secondary | ICD-10-CM | POA: Diagnosis not present

## 2016-09-25 DIAGNOSIS — C9110 Chronic lymphocytic leukemia of B-cell type not having achieved remission: Secondary | ICD-10-CM

## 2016-09-25 NOTE — Assessment & Plan Note (Signed)
76 year old male with progressive mild lymphocytic leukocytosis compared to 3 years ago. Associated slight drop in hemoglobin without frank anemia and no thrombocytopenia.   Due to suspected CLL, additional evaluation was obtained including peripheral blood flow cytometry, peripheral blood cytogenetics and Fish. Additionally, systemic imaging of the neck, chest, abdomen, and pelvis were obtained. Imaging demonstrated presence of pathological lymphadenopathy in the right mediastinum but no other locations. 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  --No immediate therapy due to lack of symptoms --Review the case with Hematology Tumor Board --On RTC: Labs, clinic visit for progression assessment --Restaging imaging appearance of new symptoms or significant changes in laboratory values  Voice recognition software was used and creation of this note. Despite my best effort at editing the text, some misspelling/errors may have occurred.

## 2016-09-25 NOTE — Telephone Encounter (Signed)
Appointments scheduled per 09/25/16 los. Patient was given a copy of the AVS report and appointment schedule per 09/25/16 los.

## 2016-09-25 NOTE — Progress Notes (Signed)
[name] Follow-up Visit:  Assessment: CLL (chronic lymphocytic leukemia) ([name]) 76 year old male with progressive mild lymphocytic leukocytosis compared to 3 years ago. Associated slight drop in hemoglobin without frank anemia and no thrombocytopenia.   Due to suspected CLL, additional evaluation was obtained including peripheral blood flow cytometry, peripheral blood cytogenetics and Fish. Additionally, systemic imaging of the neck, chest, abdomen, and pelvis were obtained. Imaging demonstrated presence of pathological lymphadenopathy in the right mediastinum but no other locations. 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  --No immediate therapy due to lack of symptoms --Review the case with Hematology Tumor Board --On RTC: Labs, clinic visit for progression assessment --Restaging imaging appearance of new symptoms or significant changes in laboratory values  Voice recognition software was used and creation of this note. Despite my best effort at editing the text, some misspelling/errors may have occurred.   Orders Placed This Encounter  Procedures  . CBC with Differential    Standing Status:   Future    Standing Expiration Date:   09/25/2017  . Comprehensive metabolic panel    Standing Status:   Future    Standing Expiration Date:   09/25/2017  . Lactate dehydrogenase (LDH)    Standing Status:   Future    Standing Expiration Date:   09/25/2017  . Beta 2 microglobulin    Standing Status:   Future    Standing Expiration Date:   09/25/2017  . QIG  (Quant. immunoglobulins  - IgG, IgA, IgM)    Standing Status:   Future    Standing Expiration Date:   09/25/2017    Cancer Staging No matching staging information was found for the patient.  All questions were answered.  . The patient  knows to call the clinic with any problems, questions or concerns.  This note was electronically signed.    History of Presenting Illness Kyle Sawyer 76 y.o. presenting to the [name] for Review of the diagnostic workup of the suspected CLL. Patient denies any new complaints since last visit to the clinic  Appears that the elevated white blood cell count was discovered last year and patient was referred to hospice this time due to persistence off lymphocytosis with progression of the elevation of the white count. At the time of discovery, patient was suffering from sinusitis which has promptly resolved. Previous evaluation revealed no signs of anemia, thrombocytopenia, or persistent infections.  Over the year, patient denies any fever, chills, night sweats. Denies and swelling in the neck, armpits, or groin. Denies any mouth, skin rash, difficulty swallowing, nausea, diminished appetite, early satiety, abdominal pain, diarrhea, or constipation. No chest pain, shortness of breath, cough, palpitations, or swelling in the lower extremities. No new urinary or neurologic complaints.   Clinical course: --External labs, 07/27/13: WBC 7.2, ALC 3.00, Hgb 14.7, Plt 144;  --External labs, 08/02/14: WBC 9.7, ALC 5.40, Hgb 14.2, Plt 162;  --External labs, 08/14/15: WBC 12.4, ALC 6.10, Hgb 13.9, Plt 180;  --External labs, 09/11/15: WBC 10.2, ALC 4.70, Hgb 13.6, Plt 161;  --CT Neck, 09/19/15: Obtained due to palpable neck swelling/lymphadenopathy. Report demonstrates no evidence of abnormal lymphadenopathy at the time, but prominent submandibular salivary glands. --External labs, 08/14/16: WBC 16.2, ALC 10.0, ANC 5.0, Mono 0.8, Eos 0.3, Baso 0.1, Hgb 13.7, Plt 187;   Oncological/hematological History:   CLL (chronic lymphocytic leukemia) ([name])  09/11/2016 Initial Diagnosis    CLL (chronic lymphocytic leukemia) ([name]) Initial diagnosis, Jun 2018:  --Labs, 09/11/16: WBC 11.9, ALC 7.3, Hgb 13.4,  Plt 156; LDH 176, beta-2 microglobulin 3.4; Haptoglobin 148; IgG 731, IgA 59, IgM 50 --Flow Cytology, 09/11/16: 76% abnormal lymphoid population; Positive for CD19, CD20, CD22, CD11C, FMC7, HLA-DR, kappa light-chain & negative for CD5, CD10, CD25, CD103; Pathology interpretation -- lymphoproliferative process with differential including marginal zone lymphoma, splenic lymphoma, CD5-negative chronic lymphocytic leukemia;   --Cytogenetics, 09/11/16: No specific CLL-associated cytogenetic abnormalities       09/17/2016 Imaging    CT N/C/A/P:  --No pathologic adenopathy in the neck. --Right hilar and pretracheal adenopathy is noted concerning for malignancy or metastatic disease. --Sclerotic density seen within T1 vertebral body which may be benign, but sclerotic metastatic lesion cannot be excluded.       Medical History: Past Medical History:  Diagnosis Date  . Adenomatous colon polyp 04/2011  . Allergic rhinitis   . Basal cell carcinoma (BCC) of forehead   . 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 Hemorrhoids   . Hypercholesterolemia   . Lipoma of back    Right scapula  . Lymphocytosis 08/14/2016  . Obstructive sleep apnea hypopnea, mild   . 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 HEMORRHOID SURGERY    . HERNIA REPAIR     LIH 2007  . SKIN SURGERY     various unspecified    Family History: History reviewed. No pertinent family history.  Social History: Social History   Social History  . Marital status: Married    Spouse name: N/A  . Number of children: N/A  . Years of education: N/A   Occupational History  . Retired [employer]   Social History Main Topics  . Smoking status: Current Every Day Smoker    Types: Pipe  . Smokeless tobacco: Never Used  . Alcohol use No  . Drug use: No  . Sexual activity: Not on file   Other Topics Concern  . Not  on file   Social History Narrative  . No narrative on file    Allergies: No Known Allergies  Medications:  Current Outpatient Prescriptions  Medication Sig Dispense Refill  . aspirin 81 MG tablet Take 81 mg by mouth daily.      Marland Kitchen atorvastatin (LIPITOR) 10 MG tablet Take 10 mg by mouth daily.    . tamsulosin (FLOMAX) 0.4 MG CAPS capsule Take 1 capsule by mouth daily.     No current facility-administered medications for this visit.     Review of Systems: Review of Systems  All other systems reviewed and are negative.    PHYSICAL EXAMINATION Blood pressure 105/67, pulse (!) 57, temperature 98.4 F (36.9 C), temperature source Oral, resp. rate 17, height 5' 10"  (1.778 m), weight 190 lb 14.4 oz (86.6 kg), SpO2 100 %.  ECOG PERFORMANCE STATUS: 0 - Asymptomatic  Physical Exam  Constitutional: He is oriented to person, place, and time and well-developed, well-nourished, and in no distress. No distress.  HENT:  Head: Normocephalic.  Right Ear: External ear normal.  Left Ear: External ear normal.  Mouth/Throat: Oropharynx is clear and moist. No oropharyngeal exudate.  Eyes: Conjunctivae and EOM are normal. Pupils are equal, round, and reactive to light. No scleral icterus.  Neck: Neck supple. No JVD present.  Cardiovascular: Normal rate, regular rhythm, normal heart sounds and  intact distal pulses.  Exam reveals no friction rub.   No murmur heard. Pulmonary/Chest: Effort normal and breath sounds normal. He has no wheezes. He exhibits no tenderness.  Abdominal: Soft. Bowel sounds are normal. He exhibits no distension and no mass. There is no tenderness. There is no rebound and no guarding.  Musculoskeletal: He exhibits no edema or tenderness.  Lymphadenopathy:       Head (right side): No submandibular adenopathy present.       Head (left side): No submandibular adenopathy present.    He has no cervical adenopathy.    He has no axillary adenopathy.       Right: No inguinal and  no supraclavicular adenopathy present.       Left: No inguinal and no supraclavicular adenopathy present.  Neurological: He is alert and oriented to person, place, and time. He has normal reflexes. No cranial nerve deficit. Gait normal.  Skin: Skin is warm. No rash noted. No erythema.  Psychiatric: Memory and affect normal.     LABORATORY DATA: I have personally reviewed the data as listed: No visits with results within 1 Week(s) from this visit.  Latest known visit with results is:  Appointment on 09/11/2016  Component Date Value Ref Range Status  . WBC 09/11/2016 11.9* 4.0 - 10.3 10e3/uL Final  . NEUT# 09/11/2016 3.8  1.5 - 6.5 10e3/uL Final  . HGB 09/11/2016 13.4  13.0 - 17.1 g/dL Final  . HCT 09/11/2016 40.7  38.4 - 49.9 % Final  . Platelets 09/11/2016 156  140 - 400 10e3/uL Final  . MCV 09/11/2016 91.3  79.3 - 98.0 fL Final  . MCH 09/11/2016 30.0  27.2 - 33.4 pg Final  . MCHC 09/11/2016 32.9  32.0 - 36.0 g/dL Final  . RBC 09/11/2016 4.46  4.20 - 5.82 10e6/uL Final  . RDW 09/11/2016 14.3  11.0 - 14.6 % Final  . lymph# 09/11/2016 7.3* 0.9 - 3.3 10e3/uL Final  . MONO# 09/11/2016 0.4  0.1 - 0.9 10e3/uL Final  . Eosinophils Absolute 09/11/2016 0.3  0.0 - 0.5 10e3/uL Final  . Basophils Absolute 09/11/2016 0.0  0.0 - 0.1 10e3/uL Final  . NEUT% 09/11/2016 32.2* 39.0 - 75.0 % Final  . LYMPH% 09/11/2016 61.7* 14.0 - 49.0 % Final  . MONO% 09/11/2016 3.0  0.0 - 14.0 % Final  . EOS% 09/11/2016 2.8  0.0 - 7.0 % Final  . BASO% 09/11/2016 0.3  0.0 - 2.0 % Final  . Retic % 09/11/2016 1.35  0.80 - 1.80 % Final  . Retic Ct Abs 09/11/2016 60.21  34.80 - 93.90 10e3/uL Final  . Immature Retic Fract 09/11/2016 5.10  3.00 - 10.60 % Final  . Sodium 09/11/2016 140  136 - 145 mEq/L Final  . Potassium 09/11/2016 4.1  3.5 - 5.1 mEq/L Final  . Chloride 09/11/2016 106  98 - 109 mEq/L Final  . CO2 09/11/2016 27  22 - 29 mEq/L Final  . Glucose 09/11/2016 73  70 - 140 mg/dl Final   Glucose reference range  is for nonfasting patients. Fasting glucose reference range is 70- 100.  Marland Kitchen BUN 09/11/2016 15.1  7.0 - 26.0 mg/dL Final  . Creatinine 09/11/2016 1.0  0.7 - 1.3 mg/dL Final  . Total Bilirubin 09/11/2016 0.65  0.20 - 1.20 mg/dL Final  . Alkaline Phosphatase 09/11/2016 76  40 - 150 U/L Final  . AST 09/11/2016 23  5 - 34 U/L Final  . ALT 09/11/2016 20  0 - 55 U/L Final  .  Total Protein 09/11/2016 6.2* 6.4 - 8.3 g/dL Final  . Albumin 09/11/2016 3.7  3.5 - 5.0 g/dL Final  . Calcium 09/11/2016 9.4  8.4 - 10.4 mg/dL Final  . Anion Gap 09/11/2016 6  3 - 11 mEq/L Final  . EGFR 09/11/2016 73* >90 ml/min/1.73 m2 Final   eGFR is calculated using the CKD-EPI Creatinine Equation (2009)  . LDH 09/11/2016 176  125 - 245 U/L Final  . Beta-2 09/11/2016 3.4* 0.6 - 2.4 mg/L Final   Siemens Immulite 2000 Immunochemiluminometric assay (ICMA)  . Uric Acid, Serum 09/11/2016 6.7  2.6 - 7.4 mg/dl Final  . Haptoglobin 09/11/2016 148  34 - 200 mg/dL Final  . Coombs', Direct 09/11/2016 Negative  Negative Final  . IgG, Qn, Serum 09/11/2016 731  700 - 1,600 mg/dL Final  . IgA, Qn, Serum 09/11/2016 59* 61 - 437 mg/dL Final  . IgM, Qn, Serum 09/11/2016 50  15 - 143 mg/dL Final  . Flow Cytometry 09/11/2016 See Separate Report   Final  . Cytogenetics, Peripheral Blood 09/11/2016 Report given to physician.   Final  . Test Methodology 09/11/2016 Chromosome analysis   Final  . FISH 09/11/2016 Report delivered to provider. To be scanned into EPIC   Final  . Technologist Review 09/11/2016 Variant lymphs present- some with frayed cytoplasm   Final       Ardath Sax, MD

## 2016-09-29 DIAGNOSIS — K635 Polyp of colon: Secondary | ICD-10-CM | POA: Diagnosis not present

## 2016-09-29 DIAGNOSIS — D126 Benign neoplasm of colon, unspecified: Secondary | ICD-10-CM | POA: Diagnosis not present

## 2016-10-09 LAB — TISSUE HYBRIDIZATION TO NCBH

## 2016-10-13 ENCOUNTER — Encounter: Payer: Medicare Other | Admitting: Internal Medicine

## 2016-10-26 DIAGNOSIS — K4090 Unilateral inguinal hernia, without obstruction or gangrene, not specified as recurrent: Secondary | ICD-10-CM | POA: Diagnosis not present

## 2016-11-16 DIAGNOSIS — Z79899 Other long term (current) drug therapy: Secondary | ICD-10-CM | POA: Diagnosis not present

## 2016-11-16 DIAGNOSIS — E782 Mixed hyperlipidemia: Secondary | ICD-10-CM | POA: Diagnosis not present

## 2016-12-25 ENCOUNTER — Encounter: Payer: Self-pay | Admitting: Hematology and Oncology

## 2016-12-25 ENCOUNTER — Ambulatory Visit (HOSPITAL_BASED_OUTPATIENT_CLINIC_OR_DEPARTMENT_OTHER): Payer: Medicare Other | Admitting: Hematology and Oncology

## 2016-12-25 ENCOUNTER — Telehealth: Payer: Self-pay | Admitting: Hematology and Oncology

## 2016-12-25 ENCOUNTER — Other Ambulatory Visit (HOSPITAL_BASED_OUTPATIENT_CLINIC_OR_DEPARTMENT_OTHER): Payer: Medicare Other

## 2016-12-25 VITALS — BP 118/67 | HR 53 | Temp 97.7°F | Resp 18 | Ht 70.0 in | Wt 192.4 lb

## 2016-12-25 DIAGNOSIS — C9190 Lymphoid leukemia, unspecified not having achieved remission: Secondary | ICD-10-CM | POA: Diagnosis not present

## 2016-12-25 DIAGNOSIS — D72820 Lymphocytosis (symptomatic): Secondary | ICD-10-CM

## 2016-12-25 DIAGNOSIS — C8582 Other specified types of non-Hodgkin lymphoma, intrathoracic lymph nodes: Secondary | ICD-10-CM

## 2016-12-25 DIAGNOSIS — C9110 Chronic lymphocytic leukemia of B-cell type not having achieved remission: Secondary | ICD-10-CM

## 2016-12-25 LAB — CBC WITH DIFFERENTIAL/PLATELET
BASO%: 0.5 % (ref 0.0–2.0)
Basophils Absolute: 0.1 10*3/uL (ref 0.0–0.1)
EOS%: 2.2 % (ref 0.0–7.0)
Eosinophils Absolute: 0.3 10*3/uL (ref 0.0–0.5)
HCT: 40.8 % (ref 38.4–49.9)
HGB: 13.9 g/dL (ref 13.0–17.1)
LYMPH%: 57.7 % — ABNORMAL HIGH (ref 14.0–49.0)
MCH: 30.5 pg (ref 27.2–33.4)
MCHC: 34.1 g/dL (ref 32.0–36.0)
MCV: 89.6 fL (ref 79.3–98.0)
MONO#: 0.7 10*3/uL (ref 0.1–0.9)
MONO%: 5.6 % (ref 0.0–14.0)
NEUT#: 4.3 10*3/uL (ref 1.5–6.5)
NEUT%: 34 % — ABNORMAL LOW (ref 39.0–75.0)
Platelets: 155 10*3/uL (ref 140–400)
RBC: 4.55 10*6/uL (ref 4.20–5.82)
RDW: 14.3 % (ref 11.0–14.6)
WBC: 12.7 10*3/uL — ABNORMAL HIGH (ref 4.0–10.3)
lymph#: 7.3 10*3/uL — ABNORMAL HIGH (ref 0.9–3.3)

## 2016-12-25 LAB — COMPREHENSIVE METABOLIC PANEL
ALT: 19 U/L (ref 0–55)
AST: 23 U/L (ref 5–34)
Albumin: 3.9 g/dL (ref 3.5–5.0)
Alkaline Phosphatase: 77 U/L (ref 40–150)
Anion Gap: 7 mEq/L (ref 3–11)
BUN: 17.6 mg/dL (ref 7.0–26.0)
CO2: 27 mEq/L (ref 22–29)
Calcium: 9.2 mg/dL (ref 8.4–10.4)
Chloride: 108 mEq/L (ref 98–109)
Creatinine: 1.1 mg/dL (ref 0.7–1.3)
EGFR: 68 mL/min/{1.73_m2} — ABNORMAL LOW (ref >90)
Glucose: 97 mg/dl (ref 70–140)
Potassium: 4.7 mEq/L (ref 3.5–5.1)
Sodium: 141 mEq/L (ref 136–145)
Total Bilirubin: 0.6 mg/dL (ref 0.20–1.20)
Total Protein: 6.5 g/dL (ref 6.4–8.3)

## 2016-12-25 LAB — LACTATE DEHYDROGENASE: LDH: 175 U/L (ref 125–245)

## 2016-12-25 LAB — TECHNOLOGIST REVIEW

## 2016-12-25 NOTE — Telephone Encounter (Signed)
Scheduled appt per 9/21 los - Gave patient AVS and calender per los.  

## 2016-12-26 LAB — IGG, IGA, IGM
IgA, Qn, Serum: 64 mg/dL (ref 61–437)
IgG, Qn, Serum: 798 mg/dL (ref 700–1600)
IgM, Qn, Serum: 49 mg/dL (ref 15–143)

## 2016-12-26 LAB — BETA 2 MICROGLOBULIN, SERUM: Beta-2: 2.6 mg/L — ABNORMAL HIGH (ref 0.6–2.4)

## 2016-12-30 DIAGNOSIS — C8582 Other specified types of non-Hodgkin lymphoma, intrathoracic lymph nodes: Secondary | ICD-10-CM | POA: Insufficient documentation

## 2016-12-30 NOTE — Progress Notes (Signed)
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 male with progressive mild lymphocytic leukocytosis compared to 3 years ago. Associated slight drop in hemoglobin without frank anemia and no thrombocytopenia. Due to suspected CLL, additional evaluation was obtained including peripheral blood flow cytometry, peripheral blood cytogenetics and FISH. Additionally, systemic imaging of the neck, chest, abdomen, and pelvis were obtained. Imaging demonstrated presence of pathological lymphadenopathy in the right mediastinum but no other locations. 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   No symptomatic change since the last visit to the clinic, lab values appear to be stable as well.  Plan: --No immediate therapy due to lack of symptoms --On RTC: CT N/C/A/P,  Labs, clinic visit for progression assessment  Voice recognition software was used and creation of this note. Despite my best effort at editing the text, some misspelling/errors may have occurred.  Orders Placed This Encounter  Procedures  . CT Chest W Contrast    Standing Status:   Future    Standing Expiration Date:   12/25/2017    Order Specific Question:   If indicated for the ordered procedure, I authorize the administration of contrast media per Radiology protocol    Answer:   Yes    Order Specific Question:   Preferred imaging location?    Answer:   [facility]    Order Specific Question:   Radiology Contrast Protocol - do NOT remove file path    Answer:   [email or web address]    Order Specific Question:   Reason  for Exam additional comments    Answer:   Marginal zone lymphoma progression evaluation  . CT Abdomen Pelvis W Contrast    Standing Status:   Future    Standing Expiration Date:   12/25/2017    Order Specific Question:   If indicated for the ordered procedure, I authorize the administration of contrast media per Radiology protocol    Answer:   Yes    Order Specific Question:   Preferred imaging location?    Answer:   [facility]    Order Specific Question:   Radiology Contrast Protocol - do NOT remove file path    Answer:   [email or web address]    Order Specific Question:   Reason for Exam additional comments    Answer:   Marginal zone lymphoma progression monitoring  . CBC with Differential    Standing Status:   Future    Standing Expiration Date:   12/25/2017  . Comprehensive metabolic panel    Standing Status:   Future    Standing Expiration Date:   12/25/2017  . Lactate dehydrogenase (LDH)    Standing Status:   Future    Standing Expiration Date:   12/25/2017  . 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  . The patient knows to call the clinic with any  problems, questions or concerns.  This note was electronically signed.    History of Presenting Illness Kyle Sawyer is a 76 y.o. 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 Patient denies any new complaints since last visit to the clinic. In the interim, he underwent hernia repair surgery which proceeded without complications.  Patient denies any interval fevers, chills, night sweats. No significant changes in his activity level or exercise  tolerance. No new gastrointestinal, genitourinary, neurological, or respiratory symptoms.  Clinical course: --External labs, 07/27/13: WBC 7.2, ALC 3.00, Hgb 14.7, Plt 144;  --External labs, 08/02/14: WBC 9.7, ALC 5.40, Hgb 14.2, Plt 162;  --External labs, 08/14/15: WBC 12.4, ALC 6.10, Hgb 13.9, Plt 180;  --External labs, 09/11/15: WBC 10.2, ALC 4.70, Hgb 13.6, Plt 161;  --CT Neck, 09/19/15: Obtained due to palpable neck swelling/lymphadenopathy. Report demonstrates no evidence of abnormal lymphadenopathy at the time, but prominent submandibular salivary glands. 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 ([place])   09/11/2016 Initial Diagnosis    Initial diagnosis, Jun 2018:  --Labs, 09/11/16: WBC 11.9, ALC 7.3, Hgb 13.4, Plt 156; LDH 176, beta-2 microglobulin 3.4; Haptoglobin 148; IgG 731, IgA 59, IgM 50 --Flow Cytology, 09/11/16: 76% abnormal lymphoid population; Positive for CD19, CD20, CD22, CD11C, FMC7, HLA-DR, kappa light-chain & negative for CD5, CD10, CD25, CD103; Pathology interpretation -- lymphoproliferative process with differential including marginal zone lymphoma, splenic lymphoma, CD5-negative chronic lymphocytic leukemia;   --Cytogenetics, 09/11/16: No specific CLL-associated cytogenetic abnormalities       09/17/2016 Imaging    CT N/C/A/P:  --No pathologic adenopathy in the neck. --Right hilar and pretracheal adenopathy is noted concerning for malignancy or metastatic disease. --Sclerotic density seen within T1 vertebral body which may be benign, but sclerotic metastatic lesion cannot be excluded.      12/25/2016 Survivorship    --Labs: WBC 12.7, ALC 7.3, Hgb 13.9, Plt 155; LDH 175, beta-2 microglobulin 2.6       Medical History: Past Medical History:  Diagnosis Date  . Adenomatous colon polyp 04/2011  . Allergic rhinitis    . Basal cell carcinoma (BCC) of forehead   . 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 Hemorrhoids   . Hypercholesterolemia   . Lipoma of back    Right scapula  . Lymphocytosis 08/14/2016  . Obstructive sleep apnea hypopnea, mild   . 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 HEMORRHOID SURGERY    . HERNIA REPAIR     LIH 2007  . SKIN SURGERY     various unspecified    Family History: History reviewed. No pertinent family history.  Social History: Social History   Social History  . Marital status: Married    Spouse name: N/A  . Number of children: N/A  . Years of education: N/A   Occupational History  . Retired [employer]   Social History Main Topics  . Smoking status: Current Every Day Smoker    Types: Pipe  . Smokeless tobacco: Never Used  . Alcohol use No  . Drug use: No  . Sexual activity: Not on file   Other Topics Concern  . Not on file   Social History Narrative  . No narrative on file  Allergies: No Known Allergies  Medications:  Current Outpatient Prescriptions  Medication Sig Dispense Refill  . aspirin 81 MG tablet Take 81 mg by mouth daily.      Marland Kitchen atorvastatin (LIPITOR) 10 MG tablet Take 10 mg by mouth daily.     No current facility-administered medications for this visit.     Review of Systems: Review of Systems  All other systems reviewed and are negative.    PHYSICAL EXAMINATION Blood pressure 118/67, pulse (!) 53, temperature 97.7 F (36.5 C), temperature source Oral, resp. rate 18, height _0  (1.778 m), weight 192 lb 6.4 oz (87.3 kg), SpO2 98 %.  ECOG PERFORMANCE STATUS: 0 - Asymptomatic  Physical Exam  Constitutional: He is oriented to person, place, and time and well-developed, well-nourished, and in no distress. No distress.  HENT:  Head: Normocephalic.  Right Ear: External ear normal.  Left Ear:  External ear normal.  Mouth/Throat: Oropharynx is clear and moist. No oropharyngeal exudate.  Eyes: Pupils are equal, round, and reactive to light. Conjunctivae and EOM are normal. No scleral icterus.  Neck: Neck supple. No JVD present.  Cardiovascular: Normal rate, regular rhythm, normal heart sounds and intact distal pulses.  Exam reveals no friction rub.   No murmur heard. Pulmonary/Chest: Effort normal and breath sounds normal. He has no wheezes. He exhibits no tenderness.  Abdominal: Soft. Bowel sounds are normal. He exhibits no distension and no mass. There is no tenderness. There is no rebound and no guarding.  Musculoskeletal: He exhibits no edema or tenderness.  Lymphadenopathy:       Head (right side): No submandibular adenopathy present.       Head (left side): No submandibular adenopathy present.    He has no cervical adenopathy.    He has no axillary adenopathy.       Right: No inguinal and no supraclavicular adenopathy present.       Left: No inguinal and no supraclavicular adenopathy present.  Neurological: He is alert and oriented to person, place, and time. He has normal reflexes. No cranial nerve deficit. Gait normal.  Skin: Skin is warm. No rash noted. No erythema.  Psychiatric: Memory and affect normal.     LABORATORY DATA: I have personally reviewed the data as listed: Appointment on 12/25/2016  Component Date Value Ref Range Status  . WBC 12/25/2016 12.7* 4.0 - 10.3 10e3/uL Final  . NEUT# 12/25/2016 4.3  1.5 - 6.5 10e3/uL Final  . HGB 12/25/2016 13.9  13.0 - 17.1 g/dL Final  . HCT 12/25/2016 40.8  38.4 - 49.9 % Final  . Platelets 12/25/2016 155  140 - 400 10e3/uL Final  . MCV 12/25/2016 89.6  79.3 - 98.0 fL Final  . MCH 12/25/2016 30.5  27.2 - 33.4 pg Final  . MCHC 12/25/2016 34.1  32.0 - 36.0 g/dL Final  . RBC 12/25/2016 4.55  4.20 - 5.82 10e6/uL Final  . RDW 12/25/2016 14.3  11.0 - 14.6 % Final  . lymph# 12/25/2016 7.3* 0.9 - 3.3 10e3/uL Final  . MONO#  12/25/2016 0.7  0.1 - 0.9 10e3/uL Final  . Eosinophils Absolute 12/25/2016 0.3  0.0 - 0.5 10e3/uL Final  . Basophils Absolute 12/25/2016 0.1  0.0 - 0.1 10e3/uL Final  . NEUT% 12/25/2016 34.0* 39.0 - 75.0 % Final  . LYMPH% 12/25/2016 57.7* 14.0 - 49.0 % Final  . MONO% 12/25/2016 5.6  0.0 - 14.0 % Final  . EOS% 12/25/2016 2.2  0.0 - 7.0 % Final  . BASO% 12/25/2016 0.5  0.0 - 2.0 % Final  . Sodium 12/25/2016 141  136 - 145 mEq/L Final  . Potassium 12/25/2016 4.7  3.5 - 5.1 mEq/L Final  . Chloride 12/25/2016 108  98 - 109 mEq/L Final  . CO2 12/25/2016 27  22 - 29 mEq/L Final  . Glucose 12/25/2016 97  70 - 140 mg/dl Final   Glucose reference range is for nonfasting patients. Fasting glucose reference range is 70- 100.  Marland Kitchen BUN 12/25/2016 17.6  7.0 - 26.0 mg/dL Final  . Creatinine 12/25/2016 1.1  0.7 - 1.3 mg/dL Final  . Total Bilirubin 12/25/2016 0.60  0.20 - 1.20 mg/dL Final  . Alkaline Phosphatase 12/25/2016 77  40 - 150 U/L Final  . AST 12/25/2016 23  5 - 34 U/L Final  . ALT 12/25/2016 19  0 - 55 U/L Final  . Total Protein 12/25/2016 6.5  6.4 - 8.3 g/dL Final  . Albumin 12/25/2016 3.9  3.5 - 5.0 g/dL Final  . Calcium 12/25/2016 9.2  8.4 - 10.4 mg/dL Final  . Anion Gap 12/25/2016 7  3 - 11 mEq/L Final  . EGFR 12/25/2016 68* >90 ml/min/1.73 m2 Final   eGFR is calculated using the CKD-EPI Creatinine Equation (2009)  . LDH 12/25/2016 175  125 - 245 U/L Final  . Beta-2 12/25/2016 2.6* 0.6 - 2.4 mg/L Final   Siemens Immulite 2000 Immunochemiluminometric assay (ICMA)  . IgG, Qn, Serum 12/25/2016 798  700 - 1,600 mg/dL Final  . IgA, Qn, Serum 12/25/2016 64  61 - 437 mg/dL Final  . IgM, Qn, Serum 12/25/2016 49  15 - 143 mg/dL Final  . Technologist Review 12/25/2016 Variant lymphs present   Final       Ardath Sax, MD

## 2016-12-30 NOTE — Assessment & Plan Note (Signed)
76 y.o. male with progressive mild lymphocytic leukocytosis compared to 3 years ago. Associated slight drop in hemoglobin without frank anemia and no thrombocytopenia. Due to suspected CLL, additional evaluation was obtained including peripheral blood flow cytometry, peripheral blood cytogenetics and FISH. Additionally, systemic imaging of the neck, chest, abdomen, and pelvis were obtained. Imaging demonstrated presence of pathological lymphadenopathy in the right mediastinum but no other locations. 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   No symptomatic change since the last visit to the clinic, lab values appear to be stable as well.  Plan: --No immediate therapy due to lack of symptoms --On RTC: CT N/C/A/P,  Labs, clinic visit for progression assessment

## 2016-12-31 DIAGNOSIS — Z23 Encounter for immunization: Secondary | ICD-10-CM | POA: Diagnosis not present

## 2017-03-01 DIAGNOSIS — Z85831 Personal history of malignant neoplasm of soft tissue: Secondary | ICD-10-CM | POA: Diagnosis not present

## 2017-03-01 DIAGNOSIS — L821 Other seborrheic keratosis: Secondary | ICD-10-CM | POA: Diagnosis not present

## 2017-03-01 DIAGNOSIS — L570 Actinic keratosis: Secondary | ICD-10-CM | POA: Diagnosis not present

## 2017-03-01 DIAGNOSIS — D485 Neoplasm of uncertain behavior of skin: Secondary | ICD-10-CM | POA: Diagnosis not present

## 2017-03-01 DIAGNOSIS — D1800 Hemangioma unspecified site: Secondary | ICD-10-CM | POA: Diagnosis not present

## 2017-03-01 DIAGNOSIS — C4442 Squamous cell carcinoma of skin of scalp and neck: Secondary | ICD-10-CM | POA: Diagnosis not present

## 2017-03-01 DIAGNOSIS — L814 Other melanin hyperpigmentation: Secondary | ICD-10-CM | POA: Diagnosis not present

## 2017-03-01 DIAGNOSIS — D225 Melanocytic nevi of trunk: Secondary | ICD-10-CM | POA: Diagnosis not present

## 2017-03-01 DIAGNOSIS — Z87898 Personal history of other specified conditions: Secondary | ICD-10-CM | POA: Diagnosis not present

## 2017-03-01 DIAGNOSIS — Z85828 Personal history of other malignant neoplasm of skin: Secondary | ICD-10-CM | POA: Diagnosis not present

## 2017-03-04 DIAGNOSIS — N401 Enlarged prostate with lower urinary tract symptoms: Secondary | ICD-10-CM | POA: Diagnosis not present

## 2017-03-04 DIAGNOSIS — C8580 Other specified types of non-Hodgkin lymphoma, unspecified site: Secondary | ICD-10-CM | POA: Diagnosis not present

## 2017-03-04 DIAGNOSIS — B029 Zoster without complications: Secondary | ICD-10-CM | POA: Diagnosis not present

## 2017-03-08 ENCOUNTER — Telehealth: Payer: Self-pay

## 2017-03-08 NOTE — Telephone Encounter (Signed)
Central scheduling called that labs will be needed before his CT on 1/2.  inbasket sent to move labs from 1/4 to 1/2. S/w pt and he wrote down times.

## 2017-04-07 ENCOUNTER — Ambulatory Visit (HOSPITAL_COMMUNITY)
Admission: RE | Admit: 2017-04-07 | Discharge: 2017-04-07 | Disposition: A | Discharge status: Home / Self Care | Payer: Medicare Other | Source: Ambulatory Visit | Attending: Hematology and Oncology | Admitting: Hematology and Oncology

## 2017-04-07 ENCOUNTER — Other Ambulatory Visit (HOSPITAL_BASED_OUTPATIENT_CLINIC_OR_DEPARTMENT_OTHER): Payer: Medicare Other

## 2017-04-07 DIAGNOSIS — I2510 Atherosclerotic heart disease of native coronary artery without angina pectoris: Secondary | ICD-10-CM | POA: Insufficient documentation

## 2017-04-07 DIAGNOSIS — I700 Atherosclerosis of aorta: Secondary | ICD-10-CM | POA: Diagnosis not present

## 2017-04-07 DIAGNOSIS — R162 Hepatomegaly with splenomegaly, not elsewhere classified: Secondary | ICD-10-CM | POA: Insufficient documentation

## 2017-04-07 DIAGNOSIS — R911 Solitary pulmonary nodule: Secondary | ICD-10-CM | POA: Diagnosis not present

## 2017-04-07 DIAGNOSIS — R599 Enlarged lymph nodes, unspecified: Secondary | ICD-10-CM | POA: Diagnosis not present

## 2017-04-07 DIAGNOSIS — C8582 Other specified types of non-Hodgkin lymphoma, intrathoracic lymph nodes: Secondary | ICD-10-CM | POA: Diagnosis not present

## 2017-04-07 LAB — COMPREHENSIVE METABOLIC PANEL
ALT: 26 U/L (ref 0–55)
AST: 22 U/L (ref 5–34)
Albumin: 4 g/dL (ref 3.5–5.0)
Alkaline Phosphatase: 73 U/L (ref 40–150)
Anion Gap: 7 mEq/L (ref 3–11)
BUN: 15.1 mg/dL (ref 7.0–26.0)
CO2: 27 mEq/L (ref 22–29)
Calcium: 9.1 mg/dL (ref 8.4–10.4)
Chloride: 106 mEq/L (ref 98–109)
Creatinine: 1.2 mg/dL (ref 0.7–1.3)
EGFR: >60 mL/min/{1.73_m2} (ref >60)
Glucose: 96 mg/dl (ref 70–140)
Potassium: 4.5 mEq/L (ref 3.5–5.1)
Sodium: 140 mEq/L (ref 136–145)
Total Bilirubin: 0.58 mg/dL (ref 0.20–1.20)
Total Protein: 6.5 g/dL (ref 6.4–8.3)

## 2017-04-07 LAB — CBC WITH DIFFERENTIAL/PLATELET
BASO%: 0.8 % (ref 0.0–2.0)
Basophils Absolute: 0.1 10*3/uL (ref 0.0–0.1)
EOS%: 1.2 % (ref 0.0–7.0)
Eosinophils Absolute: 0.2 10*3/uL (ref 0.0–0.5)
HCT: 41.8 % (ref 38.4–49.9)
HGB: 13.9 g/dL (ref 13.0–17.1)
LYMPH%: 52.4 % — ABNORMAL HIGH (ref 14.0–49.0)
MCH: 30.6 pg (ref 27.2–33.4)
MCHC: 33.4 g/dL (ref 32.0–36.0)
MCV: 91.7 fL (ref 79.3–98.0)
MONO#: 0.6 10*3/uL (ref 0.1–0.9)
MONO%: 4.7 % (ref 0.0–14.0)
NEUT#: 5 10*3/uL (ref 1.5–6.5)
NEUT%: 40.9 % (ref 39.0–75.0)
Platelets: 149 10*3/uL (ref 140–400)
RBC: 4.56 10*6/uL (ref 4.20–5.82)
RDW: 15 % — ABNORMAL HIGH (ref 11.0–14.6)
WBC: 12.2 10*3/uL — ABNORMAL HIGH (ref 4.0–10.3)
lymph#: 6.4 10*3/uL — ABNORMAL HIGH (ref 0.9–3.3)

## 2017-04-07 LAB — LACTATE DEHYDROGENASE: LDH: 225 U/L (ref 125–245)

## 2017-04-07 LAB — TECHNOLOGIST REVIEW

## 2017-04-07 LAB — URIC ACID: Uric Acid, Serum: 6.5 mg/dl (ref 2.6–7.4)

## 2017-04-07 MED ORDER — IOPAMIDOL (ISOVUE-300) INJECTION 61%
INTRAVENOUS | Status: AC
  Filled 2017-04-07: qty 100

## 2017-04-07 MED ORDER — IOPAMIDOL (ISOVUE-300) INJECTION 61%
100.0000 mL | Freq: Once | INTRAVENOUS | Status: AC | PRN
  Administered 2017-04-07: 100 mL via INTRAVENOUS

## 2017-04-09 ENCOUNTER — Encounter: Payer: Self-pay | Admitting: Hematology and Oncology

## 2017-04-09 ENCOUNTER — Telehealth: Payer: Self-pay | Admitting: Hematology and Oncology

## 2017-04-09 ENCOUNTER — Ambulatory Visit (HOSPITAL_BASED_OUTPATIENT_CLINIC_OR_DEPARTMENT_OTHER): Payer: Medicare Other | Admitting: Hematology and Oncology

## 2017-04-09 ENCOUNTER — Other Ambulatory Visit: Payer: No Typology Code available for payment source

## 2017-04-09 VITALS — BP 129/70 | HR 62 | Temp 98.2°F | Resp 18 | Ht 70.0 in | Wt 200.4 lb

## 2017-04-09 DIAGNOSIS — C8582 Other specified types of non-Hodgkin lymphoma, intrathoracic lymph nodes: Secondary | ICD-10-CM

## 2017-04-09 NOTE — Telephone Encounter (Signed)
Gave avs and calendar for april °

## 2017-04-12 DIAGNOSIS — C4442 Squamous cell carcinoma of skin of scalp and neck: Secondary | ICD-10-CM | POA: Diagnosis not present

## 2017-04-19 NOTE — Assessment & Plan Note (Signed)
77 y.o. male with progressive mild lymphocytic leukocytosis compared to 3 years ago. Associated slight drop in hemoglobin without frank anemia and no thrombocytopenia. Due to suspected CLL, additional evaluation was obtained including peripheral blood flow cytometry, peripheral blood cytogenetics and FISH. Additionally, systemic imaging of the neck, chest, abdomen, and pelvis were obtained. Imaging demonstrated presence of pathological lymphadenopathy in the right mediastinum but no other locations. 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   No symptomatic change since the last visit to the clinic, lab values appear to be stable as well.  Repeat imaging demonstrates developing splenomegaly which, at this time, is asymptomatic and not palpable.  Patient's disease is not meeting criteria for treatment as of yet.  Plan: --No immediate therapy due to lack of symptoms/treatment criteria --Return to clinic in 3 months with labs and clinical evaluation --Next disease assessment with imaging in 6 months with CT of the neck/chest/abdomen/pelvis.

## 2017-04-19 NOTE — Progress Notes (Signed)
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 male with progressive mild lymphocytic leukocytosis compared to 3 years ago. Associated slight drop in hemoglobin without frank anemia and no thrombocytopenia. Due to suspected CLL, additional evaluation was obtained including peripheral blood flow cytometry, peripheral blood cytogenetics and FISH. Additionally, systemic imaging of the neck, chest, abdomen, and pelvis were obtained. Imaging demonstrated presence of pathological lymphadenopathy in the right mediastinum but no other locations. 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   No symptomatic change since the last visit to the clinic, lab values appear to be stable as well.  Repeat imaging demonstrates developing splenomegaly which, at this time, is asymptomatic and not palpable.  Patient's disease is not meeting criteria for treatment as of yet.  Plan: --No immediate therapy due to lack of symptoms/treatment criteria --Return to clinic in 3 months with labs and clinical evaluation --Next disease assessment with imaging in 6 months with CT of the neck/chest/abdomen/pelvis.  Voice recognition software was used and creation of this note. Despite my best effort at editing the text, some misspelling/errors may have occurred.  Orders Placed This Encounter  Procedures  . CBC with Differential    Standing Status:   Future    Standing Expiration Date:   04/09/2018  . Comprehensive metabolic panel    Standing Status:   Future    Standing Expiration Date:   04/09/2018  . Lactate dehydrogenase (LDH)    Standing  Status:   Future    Standing Expiration Date:   04/09/2018  . Beta 2 microglobulin    Standing Status:   Future    Standing Expiration Date:   04/09/2018  . 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  . The patient knows to call the clinic with any problems, questions or concerns.  This note was electronically signed.    History of Presenting Illness Kyle Sawyer is a 77 y.o. [facility] for marginal zone lymphoma of mediastinal lymph nodes. Patient denies any new complaints since last visit to the clinic. In the interim, he underwent hernia repair surgery which proceeded without complications.  Patient returns to the clinic for continued hematological monitoring. Patient denies any interval fevers, chills, night sweats. No significant changes in his activity level or exercise tolerance. No new gastrointestinal, genitourinary, neurological, or respiratory symptoms. Patient did have an acute episode of left buttock and leg pain in November 2018.  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  No residual neurological deficits or neuropathy.  Clinical course: --External labs, 07/27/13: WBC 7.2, ALC 3.00, Hgb 14.7, Plt 144;  --External labs, 08/02/14: WBC 9.7, ALC 5.40, Hgb 14.2, Plt 162;  --External labs, 08/14/15: WBC 12.4, ALC 6.10, Hgb 13.9, Plt 180;  --External labs, 09/11/15: WBC 10.2, ALC 4.70, Hgb 13.6,   Plt 161;  --CT Neck, 09/19/15: Obtained due to palpable neck swelling/lymphadenopathy. Report demonstrates no evidence of abnormal lymphadenopathy at the time, but prominent  submandibular salivary glands. --External labs, 08/14/16: WBC 16.2, ALC 10.0, ANC 5.0, Mono 0.8, Eos 0.3, Baso 0.1, Hgb 13.7, Plt 187;  --Labs, 04/07/17: WBC 12.2, ALC 6.4, Hgb 13.9, Plt 149; LDH 225 --CT C/A/P, 04/07/17: Stable mediastinal lymphadenopathy.  No retroperitoneal, mesenteric, or pelvic lymphadenopathy.  Spleen measuring 16.0 cm.  Oncological/hematological History:  No history exists.    Medical History: Past Medical History:  Diagnosis Date  . Adenomatous colon polyp 04/2011  . Allergic rhinitis   . Basal cell carcinoma (BCC) of forehead   . 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 Hemorrhoids   . Hypercholesterolemia   . Lipoma of back    Right scapula  . Lymphocytosis 08/14/2016  . Obstructive sleep apnea hypopnea, mild   . 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 HEMORRHOID SURGERY    . HERNIA REPAIR     [facility] 2007  . SKIN SURGERY     various unspecified    Family History: History reviewed. No pertinent family history.  Social History: Social History   Socioeconomic History  . Marital status: Married    Spouse name: Not on file  . Number of children: Not on file  . Years of education: Not on file  . Highest education level: Not on file  Social Needs  . Financial resource strain: Not on file  . Food insecurity - worry: Not on file  . Food insecurity - inability: Not on file  . Transportation needs - medical: Not on file  . Transportation needs - non-medical: Not on file  Occupational History  . Occupation: Retired    Fish farm manager: [employer]  Tobacco Use  . Smoking status: Current Every Day Smoker    Types: Pipe  . Smokeless tobacco: Never Used  Substance and Sexual Activity  . Alcohol use: No  . Drug use: No  . Sexual activity: Not on file  Other Topics Concern  . Not on file  Social History Narrative  . Not on file     Allergies: No Known Allergies  Medications:  Current Outpatient Medications  Medication Sig Dispense Refill  . aspirin 81 MG tablet Take 81 mg by mouth daily.      Marland Kitchen atorvastatin (LIPITOR) 10 MG tablet Take 10 mg by mouth daily.     No current facility-administered medications for this visit.     Review of Systems: Review of Systems  All other systems reviewed and are negative.    PHYSICAL EXAMINATION Blood pressure 129/70, pulse 62, temperature 98.2 F (36.8 C), temperature source Oral, resp. rate 18, height 5' 10" (1.778 m), weight 200 lb 6.4 oz (90.9 kg), SpO2 99 %.  ECOG PERFORMANCE STATUS: 0 - Asymptomatic  Physical Exam  Constitutional: He is oriented to person, place, and time and well-developed, well-nourished, and in no distress. No distress.  HENT:  Head: Normocephalic.  Right Ear: External ear normal.  Left Ear: External ear normal.  Mouth/Throat: Oropharynx is clear and moist. No oropharyngeal exudate.  Eyes: Conjunctivae and EOM are normal. Pupils are equal, round, and reactive to light. No scleral icterus.  Neck: Neck supple. No JVD present.  Cardiovascular: Normal rate, regular rhythm, normal heart sounds and intact distal pulses. Exam reveals no friction rub.  No murmur heard.  Pulmonary/Chest: Effort normal and breath sounds normal. He has no wheezes. He exhibits no tenderness.  Abdominal: Soft. Bowel sounds are normal. He exhibits no distension and no mass. There is no tenderness. There is no rebound and no guarding.  Musculoskeletal: He exhibits no edema or tenderness.  Lymphadenopathy:       Head (right side): No submandibular adenopathy present.       Head (left side): No submandibular adenopathy present.    He has no cervical adenopathy.    He has no axillary adenopathy.       Right: No inguinal and no supraclavicular adenopathy present.       Left: No inguinal and no supraclavicular adenopathy present.  Neurological: He is alert and oriented to  person, place, and time. He has normal reflexes. No cranial nerve deficit. Gait normal.  Skin: Skin is warm. No rash noted. No erythema.  Psychiatric: Memory and affect normal.     LABORATORY DATA: I have personally reviewed the data as listed: Appointment on 04/07/2017  Component Date Value Ref Range Status  . WBC 04/07/2017 12.2* 4.0 - 10.3 10e3/uL Final  . NEUT# 04/07/2017 5.0  1.5 - 6.5 10e3/uL Final  . HGB 04/07/2017 13.9  13.0 - 17.1 g/dL Final  . HCT 04/07/2017 41.8  38.4 - 49.9 % Final  . Platelets 04/07/2017 149  140 - 400 10e3/uL Final  . MCV 04/07/2017 91.7  79.3 - 98.0 fL Final  . MCH 04/07/2017 30.6  27.2 - 33.4 pg Final  . MCHC 04/07/2017 33.4  32.0 - 36.0 g/dL Final  . RBC 04/07/2017 4.56  4.20 - 5.82 10e6/uL Final  . RDW 04/07/2017 15.0* 11.0 - 14.6 % Final  . lymph# 04/07/2017 6.4* 0.9 - 3.3 10e3/uL Final  . MONO# 04/07/2017 0.6  0.1 - 0.9 10e3/uL Final  . Eosinophils Absolute 04/07/2017 0.2  0.0 - 0.5 10e3/uL Final  . Basophils Absolute 04/07/2017 0.1  0.0 - 0.1 10e3/uL Final  . NEUT% 04/07/2017 40.9  39.0 - 75.0 % Final  . LYMPH% 04/07/2017 52.4* 14.0 - 49.0 % Final  . MONO% 04/07/2017 4.7  0.0 - 14.0 % Final  . EOS% 04/07/2017 1.2  0.0 - 7.0 % Final  . BASO% 04/07/2017 0.8  0.0 - 2.0 % Final  . Sodium 04/07/2017 140  136 - 145 mEq/L Final  . Potassium 04/07/2017 4.5  3.5 - 5.1 mEq/L Final  . Chloride 04/07/2017 106  98 - 109 mEq/L Final  . CO2 04/07/2017 27  22 - 29 mEq/L Final  . Glucose 04/07/2017 96  70 - 140 mg/dl Final   Glucose reference range is for nonfasting patients. Fasting glucose reference range is 70- 100.  Marland Kitchen BUN 04/07/2017 15.1  7.0 - 26.0 mg/dL Final  . Creatinine 04/07/2017 1.2  0.7 - 1.3 mg/dL Final  . Total Bilirubin 04/07/2017 0.58  0.20 - 1.20 mg/dL Final  . Alkaline Phosphatase 04/07/2017 73  40 - 150 U/L Final  . AST 04/07/2017 22  5 - 34 U/L Final  . ALT 04/07/2017 26  0 - 55 U/L Final  . Total Protein 04/07/2017 6.5  6.4 - 8.3 g/dL  Final  . Albumin 04/07/2017 4.0  3.5 - 5.0 g/dL Final  . Calcium 04/07/2017 9.1  8.4 - 10.4 mg/dL Final  . Anion Gap 04/07/2017 7  3 - 11 mEq/L Final  . EGFR 04/07/2017 >60  >60 ml/min/1.73 m2 Final   eGFR is calculated using the CKD-EPI Creatinine Equation (2009)  . LDH  04/07/2017 225  125 - 245 U/L Final  . Uric Acid, Serum 04/07/2017 6.5  2.6 - 7.4 mg/dl Final  . Technologist Review 04/07/2017 Variant lymphs present   Final       Mikhail G Perlov, MD  

## 2017-06-04 ENCOUNTER — Other Ambulatory Visit: Payer: Self-pay | Admitting: Internal Medicine

## 2017-06-04 ENCOUNTER — Ambulatory Visit
Admission: RE | Admit: 2017-06-04 | Discharge: 2017-06-04 | Disposition: A | Discharge status: Home / Self Care | Payer: Medicare Other | Source: Ambulatory Visit | Attending: Internal Medicine | Admitting: Internal Medicine

## 2017-06-04 DIAGNOSIS — R69 Illness, unspecified: Principal | ICD-10-CM

## 2017-06-04 DIAGNOSIS — R509 Fever, unspecified: Secondary | ICD-10-CM | POA: Diagnosis not present

## 2017-06-04 DIAGNOSIS — J111 Influenza due to unidentified influenza virus with other respiratory manifestations: Secondary | ICD-10-CM

## 2017-06-04 DIAGNOSIS — R0989 Other specified symptoms and signs involving the circulatory and respiratory systems: Secondary | ICD-10-CM | POA: Diagnosis not present

## 2017-06-04 DIAGNOSIS — R05 Cough: Secondary | ICD-10-CM | POA: Diagnosis not present

## 2017-06-11 DIAGNOSIS — R21 Rash and other nonspecific skin eruption: Secondary | ICD-10-CM | POA: Diagnosis not present

## 2017-06-11 DIAGNOSIS — R05 Cough: Secondary | ICD-10-CM | POA: Diagnosis not present

## 2017-06-28 ENCOUNTER — Telehealth: Payer: Self-pay | Admitting: Hematology and Oncology

## 2017-06-28 NOTE — Telephone Encounter (Signed)
CME - moved 4/4 f/u to 4/3. Spoke with patient he is aware. Other appointments remain the same.

## 2017-07-05 ENCOUNTER — Inpatient Hospital Stay: Payer: Medicare Other | Attending: Hematology and Oncology

## 2017-07-05 DIAGNOSIS — N400 Enlarged prostate without lower urinary tract symptoms: Secondary | ICD-10-CM | POA: Insufficient documentation

## 2017-07-05 DIAGNOSIS — D72819 Decreased white blood cell count, unspecified: Secondary | ICD-10-CM | POA: Diagnosis not present

## 2017-07-05 DIAGNOSIS — E7800 Pure hypercholesterolemia, unspecified: Secondary | ICD-10-CM | POA: Insufficient documentation

## 2017-07-05 DIAGNOSIS — Z86010 Personal history of colonic polyps: Secondary | ICD-10-CM | POA: Insufficient documentation

## 2017-07-05 DIAGNOSIS — C8582 Other specified types of non-Hodgkin lymphoma, intrathoracic lymph nodes: Secondary | ICD-10-CM | POA: Diagnosis not present

## 2017-07-05 DIAGNOSIS — D696 Thrombocytopenia, unspecified: Secondary | ICD-10-CM | POA: Insufficient documentation

## 2017-07-05 DIAGNOSIS — Z7982 Long term (current) use of aspirin: Secondary | ICD-10-CM | POA: Diagnosis not present

## 2017-07-05 DIAGNOSIS — F17210 Nicotine dependence, cigarettes, uncomplicated: Secondary | ICD-10-CM | POA: Diagnosis not present

## 2017-07-05 DIAGNOSIS — Z79899 Other long term (current) drug therapy: Secondary | ICD-10-CM | POA: Insufficient documentation

## 2017-07-05 DIAGNOSIS — Z85828 Personal history of other malignant neoplasm of skin: Secondary | ICD-10-CM | POA: Insufficient documentation

## 2017-07-05 DIAGNOSIS — G4730 Sleep apnea, unspecified: Secondary | ICD-10-CM | POA: Insufficient documentation

## 2017-07-05 LAB — CBC WITH DIFFERENTIAL/PLATELET
Basophils Absolute: 0 10*3/uL (ref 0.0–0.1)
Basophils Relative: 1 %
Eosinophils Absolute: 0.1 10*3/uL (ref 0.0–0.5)
Eosinophils Relative: 2 %
HCT: 36.6 % — ABNORMAL LOW (ref 38.4–49.9)
Hemoglobin: 12.3 g/dL — ABNORMAL LOW (ref 13.0–17.1)
Lymphocytes Relative: 43 %
Lymphs Abs: 3.1 10*3/uL (ref 0.9–3.3)
MCH: 31.1 pg (ref 27.2–33.4)
MCHC: 33.5 g/dL (ref 32.0–36.0)
MCV: 92.6 fL (ref 79.3–98.0)
Monocytes Absolute: 0.6 10*3/uL (ref 0.1–0.9)
Monocytes Relative: 9 %
Neutro Abs: 3.4 10*3/uL (ref 1.5–6.5)
Neutrophils Relative %: 45 %
Platelets: 152 10*3/uL (ref 140–400)
RBC: 3.96 MIL/uL — ABNORMAL LOW (ref 4.20–5.82)
RDW: 14.6 % (ref 11.0–14.6)
WBC: 7.3 10*3/uL (ref 4.0–10.3)

## 2017-07-05 LAB — COMPREHENSIVE METABOLIC PANEL
ALT: 16 U/L (ref 0–55)
AST: 18 U/L (ref 5–34)
Albumin: 3.6 g/dL (ref 3.5–5.0)
Alkaline Phosphatase: 79 U/L (ref 40–150)
Anion gap: 7 (ref 3–11)
BUN: 15 mg/dL (ref 7–26)
CO2: 24 mmol/L (ref 22–29)
Calcium: 8.9 mg/dL (ref 8.4–10.4)
Chloride: 106 mmol/L (ref 98–109)
Creatinine, Ser: 1.14 mg/dL (ref 0.70–1.30)
GFR calc Af Amer: >60 mL/min (ref >60)
GFR calc non Af Amer: >60 mL/min (ref >60)
Glucose, Bld: 102 mg/dL (ref 70–140)
Potassium: 4.2 mmol/L (ref 3.5–5.1)
Sodium: 137 mmol/L (ref 136–145)
Total Bilirubin: 0.5 mg/dL (ref 0.2–1.2)
Total Protein: 6 g/dL — ABNORMAL LOW (ref 6.4–8.3)

## 2017-07-05 LAB — URIC ACID: Uric Acid, Serum: 6 mg/dL (ref 2.6–7.4)

## 2017-07-05 LAB — LACTATE DEHYDROGENASE: LDH: 175 U/L (ref 125–245)

## 2017-07-06 LAB — BETA 2 MICROGLOBULIN, SERUM: Beta-2 Microglobulin: 4 mg/L — ABNORMAL HIGH (ref 0.6–2.4)

## 2017-07-07 ENCOUNTER — Telehealth: Payer: Self-pay

## 2017-07-07 ENCOUNTER — Inpatient Hospital Stay (HOSPITAL_BASED_OUTPATIENT_CLINIC_OR_DEPARTMENT_OTHER): Payer: Medicare Other | Admitting: Hematology and Oncology

## 2017-07-07 ENCOUNTER — Encounter: Payer: Self-pay | Admitting: Hematology and Oncology

## 2017-07-07 VITALS — BP 107/70 | HR 74 | Temp 97.7°F | Resp 18 | Ht 70.0 in | Wt 194.5 lb

## 2017-07-07 DIAGNOSIS — D72819 Decreased white blood cell count, unspecified: Secondary | ICD-10-CM | POA: Diagnosis not present

## 2017-07-07 DIAGNOSIS — G4730 Sleep apnea, unspecified: Secondary | ICD-10-CM | POA: Diagnosis not present

## 2017-07-07 DIAGNOSIS — N400 Enlarged prostate without lower urinary tract symptoms: Secondary | ICD-10-CM

## 2017-07-07 DIAGNOSIS — F17210 Nicotine dependence, cigarettes, uncomplicated: Secondary | ICD-10-CM | POA: Diagnosis not present

## 2017-07-07 DIAGNOSIS — C8582 Other specified types of non-Hodgkin lymphoma, intrathoracic lymph nodes: Secondary | ICD-10-CM

## 2017-07-07 DIAGNOSIS — Z85828 Personal history of other malignant neoplasm of skin: Secondary | ICD-10-CM

## 2017-07-07 DIAGNOSIS — Z7982 Long term (current) use of aspirin: Secondary | ICD-10-CM | POA: Diagnosis not present

## 2017-07-07 DIAGNOSIS — Z79899 Other long term (current) drug therapy: Secondary | ICD-10-CM | POA: Diagnosis not present

## 2017-07-07 DIAGNOSIS — Z86010 Personal history of colonic polyps: Secondary | ICD-10-CM

## 2017-07-07 DIAGNOSIS — E7800 Pure hypercholesterolemia, unspecified: Secondary | ICD-10-CM | POA: Diagnosis not present

## 2017-07-07 DIAGNOSIS — D696 Thrombocytopenia, unspecified: Secondary | ICD-10-CM | POA: Diagnosis not present

## 2017-07-07 NOTE — Telephone Encounter (Signed)
Printed avs and calender of upcoming appointment. Per 4/3 los 

## 2017-07-07 NOTE — Assessment & Plan Note (Signed)
77 y.o. male with progressive mild lymphocytic leukocytosis compared to 3 years ago. Associated slight drop in hemoglobin without frank anemia and no thrombocytopenia. Due to suspected CLL, additional evaluation was obtained including peripheral blood flow cytometry, peripheral blood cytogenetics and FISH. Additionally, systemic imaging of the neck, chest, abdomen, and pelvis were obtained. Imaging demonstrated presence of pathological lymphadenopathy in the right mediastinum but no other locations. 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   In the interim, patient had a resolution of his lymphocytosis with accompanying mild progression of anemia and stable platelet count.  This follows a bout of illness with influenza examination very significant for slight progression of the previously noted cervical lymphadenopathy.  None of the palpable lymph nodes qualifies bulky.  Patient remains asymptomatic.  Plan: --Continue observation - CT scanning of the neck/chest/abdomen/pelvis prior to return to the clinic. -Return to clinic in 3 months with labs and clinic visit for continued hematological monitoring.

## 2017-07-07 NOTE — Progress Notes (Signed)
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 male with progressive mild lymphocytic leukocytosis compared to 3 years ago. Associated slight drop in hemoglobin without frank anemia and no thrombocytopenia. Due to suspected CLL, additional evaluation was obtained including peripheral blood flow cytometry, peripheral blood cytogenetics and FISH. Additionally, systemic imaging of the neck, chest, abdomen, and pelvis were obtained. Imaging demonstrated presence of pathological lymphadenopathy in the right mediastinum but no other locations. 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   In the interim, patient had a resolution of his lymphocytosis with accompanying mild progression of anemia and stable platelet count.  This follows a bout of illness with influenza examination very significant for slight progression of the previously noted cervical lymphadenopathy.  None of the palpable lymph nodes qualifies bulky.  Patient remains asymptomatic.  Plan: --Continue observation - CT scanning of the neck/chest/abdomen/pelvis prior to return to the clinic. -Return to clinic in 3 months with labs and clinic visit for continued hematological monitoring.  Voice recognition software was used and creation of this note. Despite my best effort at editing the text, some misspelling/errors may have occurred.  Orders Placed This Encounter  Procedures  . CT Abdomen Pelvis W Contrast    Standing Status:   Future    Standing Expiration Date:   07/07/2018    Order Specific Question:   If indicated for the ordered procedure, I  authorize the administration of contrast media per Radiology protocol    Answer:   Yes    Order Specific Question:   Preferred imaging location?    Answer:   [facility]    Order Specific Question:   Radiology Contrast Protocol - do NOT remove file path    Answer:   [email or web address]    Order Specific Question:   Reason for Exam additional comments    Answer:   Marginal zone lymphoma, please eval for progression  . CT Chest W Contrast    Standing Status:   Future    Standing Expiration Date:   07/07/2018    Order Specific Question:   If indicated for the ordered procedure, I authorize the administration of contrast media per Radiology protocol    Answer:   Yes    Order Specific Question:   Preferred imaging location?    Answer:   [facility]    Order Specific Question:   Radiology Contrast Protocol - do NOT remove file path    Answer:   [email or web address]    Order Specific Question:   Reason for Exam additional comments    Answer:   Marginal zone lymphoma, please eval for progression  . CT Soft Tissue Neck W Contrast    Standing Status:   Future    Standing Expiration Date:   07/07/2018    Order Specific Question:   If indicated for the ordered procedure, I authorize the administration of contrast media per Radiology protocol    Answer:   Yes    Order Specific Question:   Preferred imaging location?    Answer:   [facility]    Order Specific Question:   Radiology Contrast Protocol - do NOT remove file path  Answer:   [email or web address]    Order Specific Question:   Reason for Exam additional comments    Answer:   Marginal zone lymphoma, please eval for progression  . CBC with Differential (Cancer Center Only)    Standing Status:   Future    Standing Expiration Date:   07/08/2018  . CMP ([place] only)    Standing Status:   Future    Standing Expiration Date:   07/08/2018  .  Lactate dehydrogenase (LDH)    Standing Status:   Future    Standing Expiration Date:   07/07/2018  . Beta 2 microglobulin    Standing Status:   Future    Standing Expiration Date:   07/07/2018  . QIG  (Quant. 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  . The patient knows to call the clinic with any problems, questions or concerns.  This note was electronically signed.    History of Presenting Illness Kyle Sawyer is a 77 y.o. followed in the [facility] for marginal zone lymphoma of mediastinal lymph nodes. Patient denies any new complaints since last visit to the clinic. In the interim, he underwent hernia repair surgery which proceeded without complications.  Patient returns to the clinic for continued hematological monitoring.  In the interim, patient had a bout of influenza in early March.  His wife was also sick with the same infection.  Treated with oseltamivir, but developed petechial rash in his ankles and medication was stopped.  Patient denies any significant changes to his activity level.  Denies any fever, chills, night sweats.  He has noted that he lymph nodes in the neck are somewhat more prominent than previously.  Denies any chest pain, shortness of breath, or cough.  No nausea, vomiting, abdominal pain, diarrhea, constipation, or early satiety.  Oncological/hematological History: --External labs, 07/27/13: WBC 7.2, ALC 3.00, Hgb 14.7, Plt 144;  --External labs, 08/02/14: WBC 9.7, ALC 5.40, Hgb 14.2, Plt 162;  --External labs, 08/14/15: WBC 12.4, ALC 6.10, Hgb 13.9, Plt 180;  --External labs, 09/11/15: WBC 10.2, ALC 4.70, Hgb 13.6,  Plt 161;  --CT Neck, 09/19/15: Obtained due to palpable neck swelling/lymphadenopathy. Report demonstrates no evidence of abnormal lymphadenopathy at the time, but prominent submandibular salivary glands. --External labs, 08/14/16: WBC 16.2, ALC 10.0, ANC 5.0, Mono 0.8, Eos 0.3, Baso 0.1, Hgb 13.7, Plt 187;  --Labs, 04/07/17: WBC 12.2, ALC 6.4, Hgb 13.9, Plt 149; LDH 225 --CT C/A/P, 04/07/17: Stable mediastinal lymphadenopathy.  No retroperitoneal, mesenteric, or pelvic lymphadenopathy.  Spleen measuring 16.0 cm. --Labs, 07/07/17: WBC   7.3, ALC 3.1, Hgb 12.3, Plt 152; LDH 175, beta-2 microglobulin 4.0   No history exists.    Medical History: Past Medical History:  Diagnosis Date  . Adenomatous colon polyp 04/2011  . Allergic rhinitis   . Basal cell carcinoma (BCC) of forehead   . 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 Hemorrhoids   . Hypercholesterolemia   . Lipoma of back    Right scapula  . Lymphocytosis 08/14/2016  . Obstructive sleep apnea hypopnea, mild   . 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 HEMORRHOID SURGERY    . HERNIA REPAIR     LIH 2007  . SKIN SURGERY     various unspecified    Family History: History reviewed. No pertinent family history.  Social History: Social History   Socioeconomic History  . Marital status: Married    Spouse name: Not on file  . Number of children: Not on file  . Years of education: Not on file  . Highest education level: Not on file  Occupational History  . Occupation: Retired    Fish farm manager: [employer]  . Financial resource strain: Not on file  . Food insecurity:    Worry: Not on file    Inability: Not on file  . Transportation needs:    Medical: Not on file    Non-medical: Not on file  Tobacco Use  . Smoking status: Current Every Day Smoker    Types: Pipe  . Smokeless tobacco: Never  Used  Substance and Sexual Activity  . Alcohol use: No  . Drug use: No  . Sexual activity: Not on file  Lifestyle  . Physical activity:    Days per week: Not on file    Minutes per session: Not on file  . Stress: Not on file  Relationships  . Social connections:    Talks on phone: Not on file    Gets together: Not on file    Attends religious service: Not on file    Active member of club or organization: Not on file    Attends meetings of clubs or organizations: Not on file    Relationship status: Not on file  . Intimate partner violence:    Fear of current or ex partner: Not on file    Emotionally abused: Not on file    Physically abused: Not on file    Forced sexual activity: Not on file  Other Topics Concern  . Not on file  Social History Narrative  . Not on file    Allergies: No Known Allergies  Medications:  Current Outpatient Medications  Medication Sig Dispense Refill  . aspirin 81 MG tablet Take 81 mg by mouth daily.      Marland Kitchen atorvastatin (LIPITOR) 10 MG tablet Take 10 mg by mouth daily.     No current facility-administered medications for this visit.     Review of Systems: Review of Systems  All other systems reviewed and are negative.    PHYSICAL EXAMINATION Blood pressure 107/70, pulse 74, temperature 97.7 F (36.5 C), temperature source Oral, resp. rate 18, height '5\' 10"'$  (1.778 m), weight 194 lb 8 oz (88.2 kg), SpO2 100 %.  ECOG PERFORMANCE STATUS: 0 - Asymptomatic  Physical Exam  Constitutional: He is oriented to person, place, and time and well-developed, well-nourished, and in no distress. No distress.  HENT:  Head: Normocephalic.  Right Ear: External ear normal.  Left Ear: External ear normal.  Mouth/Throat: Oropharynx is clear and moist. No oropharyngeal exudate.  Eyes: Pupils are equal, round, and reactive to light. Conjunctivae and EOM are normal. No scleral icterus.  Neck: Neck supple. No JVD present.  Cardiovascular: Normal rate,  regular rhythm, normal heart sounds and intact distal pulses. Exam reveals no friction rub.  No murmur heard. Pulmonary/Chest: Effort normal and breath sounds normal. He has no wheezes. He exhibits no tenderness.  Abdominal: Soft. Bowel sounds are normal. He exhibits no distension and  no mass. There is no tenderness. There is no rebound and no guarding.  Musculoskeletal: He exhibits no edema or tenderness.  Lymphadenopathy:       Head (right side): Submandibular and tonsillar adenopathy present. No submental, no preauricular, no posterior auricular and no occipital adenopathy present.       Head (left side): Submandibular adenopathy present. No submental, no tonsillar, no preauricular, no posterior auricular and no occipital adenopathy present.    He has no cervical adenopathy.    He has no axillary adenopathy.       Right: No inguinal and no supraclavicular adenopathy present.       Left: No inguinal and no supraclavicular adenopathy present.  Dominant lymph node is in the submandibular trigone of the right neck measuring about about 2 cm in the largest dimension, freely mobile and hard to texture.  Nontender.  Neurological: He is alert and oriented to person, place, and time. He has normal reflexes. No cranial nerve deficit. Gait normal.  Skin: Skin is warm. No rash noted. No erythema.  Psychiatric: Memory and affect normal.     LABORATORY DATA: I have personally reviewed the data as listed: Appointment on 07/05/2017  Component Date Value Ref Range Status  . Uric Acid, Serum 07/05/2017 6.0  2.6 - 7.4 mg/dL Final   Performed at [facility], [place] [address], [place], [place] [address]  . Beta-2 Microglobulin 07/05/2017 4.0* 0.6 - 2.4 mg/L Final   Comment: (NOTE) Siemens Immulite 2000 Immunochemiluminometric assay (ICMA) Values obtained with different assay methods or kits cannot be used interchangeably. Results cannot be interpreted as absolute evidence of the  presence or absence of malignant disease. Performed At: [facility] [place], [place] [id number] Rush Farmer MD PI:[id number] Performed at [facility], [place] [address] [place], [place] [address]   . LDH 07/05/2017 175  125 - 245 U/L Final   Performed at [facility], [place] [address] [place], [place] [address]  . Sodium 07/05/2017 137  136 - 145 mmol/L Final  . Potassium 07/05/2017 4.2  3.5 - 5.1 mmol/L Final  . Chloride 07/05/2017 106  98 - 109 mmol/L Final  . CO2 07/05/2017 24  22 - 29 mmol/L Final  . Glucose, Bld 07/05/2017 102  70 - 140 mg/dL Final  . BUN 07/05/2017 15  7 - 26 mg/dL Final  . Creatinine, Ser 07/05/2017 1.14  0.70 - 1.30 mg/dL Final  . Calcium 07/05/2017 8.9  8.4 - 10.4 mg/dL Final  . Total Protein 07/05/2017 6.0* 6.4 - 8.3 g/dL Final  . Albumin 07/05/2017 3.6  3.5 - 5.0 g/dL Final  . AST 07/05/2017 18  5 - 34 U/L Final  . ALT 07/05/2017 16  0 - 55 U/L Final  . Alkaline Phosphatase 07/05/2017 79  40 - 150 U/L Final  . Total Bilirubin 07/05/2017 0.5  0.2 - 1.2 mg/dL Final  . GFR calc non Af Amer 07/05/2017 >60  >60 mL/min Final  . GFR calc Af Amer 07/05/2017 >60  >60 mL/min Final   Comment: (NOTE) The eGFR has been calculated using the CKD EPI equation. This calculation has not been validated in all clinical situations. eGFR's persistently <60 mL/min signify possible Chronic Kidney Disease.   Georgiann Hahn gap 07/05/2017 7  3 - 11 Final   Performed at [facility], [place] [address] [place], [place] [address]  . WBC 07/05/2017 7.3  4.0 - 10.3 K/uL  Final  . RBC 07/05/2017 3.96* 4.20 - 5.82 MIL/uL Final  . Hemoglobin 07/05/2017 12.3* 13.0 - 17.1 g/dL Final  . HCT 07/05/2017 36.6* 38.4 - 49.9 % Final  . MCV 07/05/2017 92.6  79.3 - 98.0 fL Final  . MCH 07/05/2017 31.1  27.2 - 33.4 pg Final  . MCHC 07/05/2017 33.5  32.0 - 36.0 g/dL Final  . RDW 07/05/2017 14.6  11.0 - 14.6  % Final  . Platelets 07/05/2017 152  140 - 400 K/uL Final  . Neutrophils Relative % 07/05/2017 45  % Final  . Neutro Abs 07/05/2017 3.4  1.5 - 6.5 K/uL Final  . Lymphocytes Relative 07/05/2017 43  % Final  . Lymphs Abs 07/05/2017 3.1  0.9 - 3.3 K/uL Final  . Monocytes Relative 07/05/2017 9  % Final  . Monocytes Absolute 07/05/2017 0.6  0.1 - 0.9 K/uL Final  . Eosinophils Relative 07/05/2017 2  % Final  . Eosinophils Absolute 07/05/2017 0.1  0.0 - 0.5 K/uL Final  . Basophils Relative 07/05/2017 1  % Final  . Basophils Absolute 07/05/2017 0.0  0.0 - 0.1 K/uL Final   Performed at [facility], [place] [address] [place], [place] [address]       Ardath Sax, MD

## 2017-07-08 ENCOUNTER — Ambulatory Visit: Payer: Medicare Other | Admitting: Hematology and Oncology

## 2017-08-20 DIAGNOSIS — E782 Mixed hyperlipidemia: Secondary | ICD-10-CM | POA: Diagnosis not present

## 2017-08-20 DIAGNOSIS — Z79899 Other long term (current) drug therapy: Secondary | ICD-10-CM | POA: Diagnosis not present

## 2017-08-20 DIAGNOSIS — D696 Thrombocytopenia, unspecified: Secondary | ICD-10-CM | POA: Diagnosis not present

## 2017-08-20 DIAGNOSIS — Z1389 Encounter for screening for other disorder: Secondary | ICD-10-CM | POA: Diagnosis not present

## 2017-08-20 DIAGNOSIS — Z0000 Encounter for general adult medical examination without abnormal findings: Secondary | ICD-10-CM | POA: Diagnosis not present

## 2017-08-20 DIAGNOSIS — C8580 Other specified types of non-Hodgkin lymphoma, unspecified site: Secondary | ICD-10-CM | POA: Diagnosis not present

## 2017-08-27 ENCOUNTER — Telehealth: Payer: Self-pay | Admitting: Hematology and Oncology

## 2017-08-27 NOTE — Telephone Encounter (Signed)
Called pt re appt being moved to [place]- left vm for pt and sending confirmation letter in the mail.

## 2017-10-04 ENCOUNTER — Encounter (HOSPITAL_COMMUNITY): Payer: Self-pay

## 2017-10-04 ENCOUNTER — Inpatient Hospital Stay: Payer: Medicare Other | Attending: Hematology and Oncology

## 2017-10-04 ENCOUNTER — Ambulatory Visit (HOSPITAL_COMMUNITY)
Admission: RE | Admit: 2017-10-04 | Discharge: 2017-10-04 | Disposition: A | Discharge status: Home / Self Care | Payer: Medicare Other | Source: Ambulatory Visit | Attending: Hematology and Oncology | Admitting: Hematology and Oncology

## 2017-10-04 DIAGNOSIS — Z85828 Personal history of other malignant neoplasm of skin: Secondary | ICD-10-CM | POA: Diagnosis not present

## 2017-10-04 DIAGNOSIS — C8582 Other specified types of non-Hodgkin lymphoma, intrathoracic lymph nodes: Secondary | ICD-10-CM | POA: Diagnosis not present

## 2017-10-04 DIAGNOSIS — I2510 Atherosclerotic heart disease of native coronary artery without angina pectoris: Secondary | ICD-10-CM | POA: Diagnosis not present

## 2017-10-04 DIAGNOSIS — Z7982 Long term (current) use of aspirin: Secondary | ICD-10-CM | POA: Insufficient documentation

## 2017-10-04 DIAGNOSIS — K769 Liver disease, unspecified: Secondary | ICD-10-CM | POA: Diagnosis not present

## 2017-10-04 DIAGNOSIS — G4730 Sleep apnea, unspecified: Secondary | ICD-10-CM | POA: Diagnosis not present

## 2017-10-04 DIAGNOSIS — E7800 Pure hypercholesterolemia, unspecified: Secondary | ICD-10-CM | POA: Diagnosis not present

## 2017-10-04 DIAGNOSIS — I700 Atherosclerosis of aorta: Secondary | ICD-10-CM | POA: Diagnosis not present

## 2017-10-04 DIAGNOSIS — K529 Noninfective gastroenteritis and colitis, unspecified: Secondary | ICD-10-CM | POA: Insufficient documentation

## 2017-10-04 DIAGNOSIS — Z86010 Personal history of colonic polyps: Secondary | ICD-10-CM | POA: Diagnosis not present

## 2017-10-04 DIAGNOSIS — C884 Extranodal marginal zone B-cell lymphoma of mucosa-associated lymphoid tissue [MALT-lymphoma]: Secondary | ICD-10-CM | POA: Diagnosis not present

## 2017-10-04 DIAGNOSIS — R599 Enlarged lymph nodes, unspecified: Secondary | ICD-10-CM | POA: Insufficient documentation

## 2017-10-04 DIAGNOSIS — F17210 Nicotine dependence, cigarettes, uncomplicated: Secondary | ICD-10-CM | POA: Insufficient documentation

## 2017-10-04 DIAGNOSIS — N400 Enlarged prostate without lower urinary tract symptoms: Secondary | ICD-10-CM | POA: Diagnosis not present

## 2017-10-04 DIAGNOSIS — I358 Other nonrheumatic aortic valve disorders: Secondary | ICD-10-CM | POA: Insufficient documentation

## 2017-10-04 DIAGNOSIS — R161 Splenomegaly, not elsewhere classified: Secondary | ICD-10-CM | POA: Insufficient documentation

## 2017-10-04 DIAGNOSIS — R590 Localized enlarged lymph nodes: Secondary | ICD-10-CM | POA: Diagnosis not present

## 2017-10-04 DIAGNOSIS — Z79899 Other long term (current) drug therapy: Secondary | ICD-10-CM | POA: Insufficient documentation

## 2017-10-04 HISTORY — DX: Non-Hodgkin lymphoma, unspecified, unspecified site: C85.90

## 2017-10-04 LAB — CMP (CANCER CENTER ONLY)
ALT: 16 U/L (ref 0–44)
AST: 24 U/L (ref 15–41)
Albumin: 4.3 g/dL (ref 3.5–5.0)
Alkaline Phosphatase: 78 U/L (ref 38–126)
Anion gap: 7 (ref 5–15)
BUN: 15 mg/dL (ref 8–23)
CO2: 27 mmol/L (ref 22–32)
Calcium: 8.9 mg/dL (ref 8.9–10.3)
Chloride: 105 mmol/L (ref 98–111)
Creatinine: 1.13 mg/dL (ref 0.61–1.24)
GFR, Est AFR Am: >60 mL/min (ref >60)
GFR, Estimated: >60 mL/min (ref >60)
Glucose, Bld: 86 mg/dL (ref 70–99)
Potassium: 4.1 mmol/L (ref 3.5–5.1)
Sodium: 139 mmol/L (ref 135–145)
Total Bilirubin: 0.7 mg/dL (ref 0.3–1.2)
Total Protein: 6.5 g/dL (ref 6.5–8.1)

## 2017-10-04 LAB — CBC WITH DIFFERENTIAL (CANCER CENTER ONLY)
Basophils Absolute: 0 10*3/uL (ref 0.0–0.1)
Basophils Relative: 0 %
Eosinophils Absolute: 0.2 10*3/uL (ref 0.0–0.5)
Eosinophils Relative: 2 %
HCT: 39.5 % (ref 38.4–49.9)
Hemoglobin: 13.3 g/dL (ref 13.0–17.1)
Lymphocytes Relative: 54 %
Lymphs Abs: 6.4 10*3/uL — ABNORMAL HIGH (ref 0.9–3.3)
MCH: 30.7 pg (ref 27.2–33.4)
MCHC: 33.7 g/dL (ref 32.0–36.0)
MCV: 91.2 fL (ref 79.3–98.0)
Monocytes Absolute: 0.7 10*3/uL (ref 0.1–0.9)
Monocytes Relative: 6 %
Neutro Abs: 4.4 10*3/uL (ref 1.5–6.5)
Neutrophils Relative %: 38 %
Platelet Count: 152 10*3/uL (ref 140–400)
RBC: 4.33 MIL/uL (ref 4.20–5.82)
RDW: 13.7 % (ref 11.0–14.6)
WBC Count: 11.7 10*3/uL — ABNORMAL HIGH (ref 4.0–10.3)

## 2017-10-04 LAB — LACTATE DEHYDROGENASE: LDH: 187 U/L (ref 98–192)

## 2017-10-04 MED ORDER — IOPAMIDOL (ISOVUE-300) INJECTION 61%
INTRAVENOUS | Status: AC
  Filled 2017-10-04: qty 100

## 2017-10-04 MED ORDER — IOPAMIDOL (ISOVUE-300) INJECTION 61%
100.0000 mL | Freq: Once | INTRAVENOUS | Status: AC | PRN
  Administered 2017-10-04: 100 mL via INTRAVENOUS

## 2017-10-05 LAB — IGG, IGA, IGM
IgA: 53 mg/dL — ABNORMAL LOW (ref 61–437)
IgG (Immunoglobin G), Serum: 688 mg/dL — ABNORMAL LOW (ref 700–1600)
IgM (Immunoglobulin M), Srm: 34 mg/dL (ref 15–143)

## 2017-10-05 LAB — BETA 2 MICROGLOBULIN, SERUM: Beta-2 Microglobulin: 3.2 mg/L — ABNORMAL HIGH (ref 0.6–2.4)

## 2017-10-06 ENCOUNTER — Telehealth: Payer: Self-pay | Admitting: Hematology

## 2017-10-06 ENCOUNTER — Inpatient Hospital Stay (HOSPITAL_BASED_OUTPATIENT_CLINIC_OR_DEPARTMENT_OTHER): Payer: Medicare Other | Admitting: Hematology

## 2017-10-06 VITALS — BP 111/67 | HR 56 | Temp 97.7°F | Resp 18 | Ht 70.0 in | Wt 192.5 lb

## 2017-10-06 DIAGNOSIS — Z86010 Personal history of colonic polyps: Secondary | ICD-10-CM

## 2017-10-06 DIAGNOSIS — G4730 Sleep apnea, unspecified: Secondary | ICD-10-CM | POA: Diagnosis not present

## 2017-10-06 DIAGNOSIS — Z79899 Other long term (current) drug therapy: Secondary | ICD-10-CM

## 2017-10-06 DIAGNOSIS — E7800 Pure hypercholesterolemia, unspecified: Secondary | ICD-10-CM | POA: Diagnosis not present

## 2017-10-06 DIAGNOSIS — N400 Enlarged prostate without lower urinary tract symptoms: Secondary | ICD-10-CM

## 2017-10-06 DIAGNOSIS — R161 Splenomegaly, not elsewhere classified: Secondary | ICD-10-CM

## 2017-10-06 DIAGNOSIS — R599 Enlarged lymph nodes, unspecified: Secondary | ICD-10-CM | POA: Diagnosis not present

## 2017-10-06 DIAGNOSIS — F17210 Nicotine dependence, cigarettes, uncomplicated: Secondary | ICD-10-CM

## 2017-10-06 DIAGNOSIS — Z85828 Personal history of other malignant neoplasm of skin: Secondary | ICD-10-CM

## 2017-10-06 DIAGNOSIS — I700 Atherosclerosis of aorta: Secondary | ICD-10-CM | POA: Diagnosis not present

## 2017-10-06 DIAGNOSIS — Z7982 Long term (current) use of aspirin: Secondary | ICD-10-CM

## 2017-10-06 DIAGNOSIS — C8582 Other specified types of non-Hodgkin lymphoma, intrathoracic lymph nodes: Secondary | ICD-10-CM

## 2017-10-06 DIAGNOSIS — I2510 Atherosclerotic heart disease of native coronary artery without angina pectoris: Secondary | ICD-10-CM

## 2017-10-06 NOTE — Progress Notes (Signed)
HEMATOLOGY/ONCOLOGY CONSULTATION NOTE  Date of Service: 10/06/2017  Patient Care Team: [name], MD as PCP - General (Internal Medicine)  CHIEF COMPLAINTS/PURPOSE OF CONSULTATION:  B-Cell Lymphoma   HISTORY OF PRESENTING ILLNESS:   DELFIN Sawyer is a wonderful 77 y.o. 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 He is accompanied today by his wife. The pt reports that he is doing well overall.   The pt had a blood flow cytometry on 09/11/16 which revealed a monoclonal B cell population, positive for CD20. A 09/11/16 FISH - CLL Panel was unremarkable save for 2.5% cells showing loss of 13q34 and 2% cells showing loss of p53. The pt has thus far been followed with repeat CT C/A/P every 3 months and last had a CT on 10/04/17 as noted below.   The pt reports that he has not developed any new concerns or symptoms since his last visit with Dr Lebron Conners in April.   The pt denies having ever received a blood transfusion nor has any tattoos.   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   He notes that he had the old shingles vaccine but had a shingles outbreak in December 2018.   Of note prior to the patient's visit today, pt has had CT C/A/P completed on 10/04/17 with results revealing Persistent splenomegaly. 2. Mediastinal and right hilar lymphadenopathy has regressed. 3. No new sites of disease are noted elsewhere in the chest, abdomen or pelvis. 4. Subtle inflammatory changes in the fat of the sigmoid mesocolon. This is of uncertain etiology and significance. Typically this is seen in the setting of diverticular disease, however, no adjacent colonic diverticulae are noted. Attention on routine imaging follow-up is recommended to ensure resolution. 5. Aortic atherosclerosis, in addition to left main and 3 vessel coronary artery disease. Assessment for potential risk factor modification,  dietary therapy or pharmacologic therapy may be warranted, if clinically indicated. 6. There are calcifications of the aortic valve. Echocardiographic correlation for evaluation of potential valvular dysfunction may be warranted if clinically indicated. 7. Additional incidental findings.   The pt also had a CT Soft Tissue Neck on 10/04/17 which revealed No evidence of cervical lymphadenopathy.   Most recent lab results (10/04/17) of CBC w/diff, CMP  is as follows: all values are WNL except for WBC at 11.7k, Lymphs abs at 6.4. LDH 10/04/17 is WNL at 187  On review of systems, pt reports stable energy levels, and denies fevers, chills, night sweats, unexpected weight loss, abdominal pains, chest pain, shortness of breath, leg swelling, and any other symptoms.   MEDICAL HISTORY:  Past Medical History:  Diagnosis Date  . Adenomatous colon polyp 04/2011  . Allergic rhinitis   . Basal cell carcinoma (BCC) of forehead   . 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 Hemorrhoids   . Hypercholesterolemia   . Lipoma of back    Right scapula  . Lymphocytosis 08/14/2016  . Lymphoma ([place]) dx'd 09/2016  . Obstructive sleep apnea hypopnea, mild   . 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 HEMORRHOID SURGERY    . HERNIA REPAIR     LIH 2007  . SKIN SURGERY     various unspecified    SOCIAL HISTORY: Social History   Socioeconomic History  . Marital  status: Married    Spouse name: Not on file  . Number of children: Not on file  . Years of education: Not on file  . Highest education level: Not on file  Occupational History  . Occupation: Retired    Fish farm manager: [employer]  . Financial resource strain: Not on file  . Food insecurity:    Worry: Not on file    Inability: Not on file  . Transportation needs:    Medical: Not on file    Non-medical: Not on file  Tobacco  Use  . Smoking status: Current Every Day Smoker    Types: Pipe  . Smokeless tobacco: Never Used  Substance and Sexual Activity  . Alcohol use: No  . Drug use: No  . Sexual activity: Not on file  Lifestyle  . Physical activity:    Days per week: Not on file    Minutes per session: Not on file  . Stress: Not on file  Relationships  . Social connections:    Talks on phone: Not on file    Gets together: Not on file    Attends religious service: Not on file    Active member of club or organization: Not on file    Attends meetings of clubs or organizations: Not on file    Relationship status: Not on file  . Intimate partner violence:    Fear of current or ex partner: Not on file    Emotionally abused: Not on file    Physically abused: Not on file    Forced sexual activity: Not on file  Other Topics Concern  . Not on file  Social History Narrative  . Not on file    FAMILY HISTORY: No family history on file.  ALLERGIES:  has No Known Allergies.  MEDICATIONS:  Current Outpatient Medications  Medication Sig Dispense Refill  . aspirin 81 MG tablet Take 81 mg by mouth daily.      Marland Kitchen atorvastatin (LIPITOR) 10 MG tablet Take 10 mg by mouth daily.     No current facility-administered medications for this visit.     REVIEW OF SYSTEMS:    10 Point review of Systems was done is negative except as noted above.  PHYSICAL EXAMINATION: ECOG PERFORMANCE STATUS: 1 - Symptomatic but completely ambulatory  . Vitals:   10/06/17 1428  BP: 111/67  Pulse: (!) 56  Resp: 18  Temp: 97.7 F (36.5 C)  SpO2: 99%   Filed Weights   10/06/17 1428  Weight: 192 lb 8 oz (87.3 kg)   .Body mass index is 27.62 kg/m.  GENERAL:alert, in no acute distress and comfortable SKIN: no acute rashes, no significant lesions EYES: conjunctiva are pink and non-injected, sclera anicteric OROPHARYNX: MMM, no exudates, no oropharyngeal erythema or ulceration NECK: supple, no JVD LYMPH:  no palpable  lymphadenopathy in the cervical, axillary or inguinal regions LUNGS: clear to auscultation b/l with normal respiratory effort HEART: regular rate & rhythm ABDOMEN:  normoactive bowel sounds , non tender, not distended.spleen just palpable under the LCM Extremity: no pedal edema PSYCH: alert & oriented x 3 with fluent speech NEURO: no focal motor/sensory deficits  LABORATORY DATA:  I have reviewed the data as listed  . CBC Latest Ref Rng & Units 10/04/2017 07/05/2017 04/07/2017  WBC 4.0 - 10.3 K/uL 11.7(H) 7.3 12.2(H)  Hemoglobin 13.0 - 17.1 g/dL 13.3 12.3(L) 13.9  Hematocrit 38.4 - 49.9 % 39.5 36.6(L) 41.8  Platelets 140 - 400 K/uL 152 152  149   . CBC    Component Value Date/Time   WBC 11.7 (H) 10/04/2017 1109   WBC 7.3 07/05/2017 1055   RBC 4.33 10/04/2017 1109   HGB 13.3 10/04/2017 1109   HGB 13.9 04/07/2017 0939   HCT 39.5 10/04/2017 1109   HCT 41.8 04/07/2017 0939   PLT 152 10/04/2017 1109   PLT 149 04/07/2017 0939   MCV 91.2 10/04/2017 1109   MCV 91.7 04/07/2017 0939   MCH 30.7 10/04/2017 1109   MCHC 33.7 10/04/2017 1109   RDW 13.7 10/04/2017 1109   RDW 15.0 (H) 04/07/2017 0939   LYMPHSABS 6.4 (H) 10/04/2017 1109   LYMPHSABS 6.4 (H) 04/07/2017 0939   MONOABS 0.7 10/04/2017 1109   MONOABS 0.6 04/07/2017 0939   EOSABS 0.2 10/04/2017 1109   EOSABS 0.2 04/07/2017 0939   BASOSABS 0.0 10/04/2017 1109   BASOSABS 0.1 04/07/2017 0939     . CMP Latest Ref Rng & Units 10/04/2017 07/05/2017 04/07/2017  Glucose 70 - 99 mg/dL 86 102 96  BUN 8 - 23 mg/dL 15 15 15.1  Creatinine 0.61 - 1.24 mg/dL 1.13 1.14 1.2  Sodium 135 - 145 mmol/L 139 137 140  Potassium 3.5 - 5.1 mmol/L 4.1 4.2 4.5  Chloride 98 - 111 mmol/L 105 106 -  CO2 22 - 32 mmol/L 27 24 27   Calcium 8.9 - 10.3 mg/dL 8.9 8.9 9.1  Total Protein 6.5 - 8.1 g/dL 6.5 6.0(L) 6.5  Total Bilirubin 0.3 - 1.2 mg/dL 0.7 0.5 0.58  Alkaline Phos 38 - 126 U/L 78 79 73  AST 15 - 41 U/L 24 18 22   ALT 0 - 44 U/L 16 16 26    09/11/16  Molecular Pathology:   09/11/16 Peripheral blood flow cytometry:      RADIOGRAPHIC STUDIES: I have personally reviewed the radiological images as listed and agreed with the findings in the report. Ct Soft Tissue Neck W Contrast  Result Date: 10/04/2017 CLINICAL DATA:  Marginal zone lymphoma.  Evaluation for progression. EXAM: CT NECK WITH CONTRAST TECHNIQUE: Multidetector CT imaging of the neck was performed using the standard protocol following the bolus administration of intravenous contrast. CONTRAST:  161m ISOVUE-300 IOPAMIDOL (ISOVUE-300) INJECTION 61% COMPARISON:  09/17/2016 FINDINGS: Pharynx and larynx: Unchanged pharyngeal soft tissue contours and asymmetric effacement of the left vallecula without mass identified. Bilateral palatine tonsillar calcifications again noted. No retropharyngeal fluid collection. Unremarkable larynx Salivary glands: No inflammation, mass, or stone. Thyroid: Unremarkable. Lymph nodes: No enlarged or suspicious lymph nodes in the neck. Vascular: Major vascular structures of the neck are patent. Mastoids and visualized paranasal sinuses: Clear. Skeleton: Unchanged sclerosis in the T1 vertebral body, possibly a bone island. Progressive disc degeneration at C7-T1 with severe disc space narrowing and degenerative endplate changes including a Schmorl's node type deformity involving the right lateral aspect of the T1 superior endplate. Upper chest: Reported separately. Other: None. IMPRESSION: No evidence of cervical lymphadenopathy. Electronically Signed   By: ALogan BoresM.D.   On: 10/04/2017 14:32   Ct Chest W Contrast  Result Date: 10/04/2017 CLINICAL DATA:  77year old male with history of lymphoma diagnosed in June 2018. Followup study. EXAM: CT CHEST, ABDOMEN, AND PELVIS WITH CONTRAST TECHNIQUE: Multidetector CT imaging of the chest, abdomen and pelvis was performed following the standard protocol during bolus administration of intravenous contrast. CONTRAST:  1041m ISOVUE-300 IOPAMIDOL (ISOVUE-300) INJECTION 61% COMPARISON:  CT the chest, abdomen and pelvis 04/07/2017. FINDINGS: CT CHEST FINDINGS Cardiovascular: Heart size is normal. There is no significant  pericardial fluid, thickening or pericardial calcification. There is aortic atherosclerosis, as well as atherosclerosis of the great vessels of the mediastinum and the coronary arteries, including calcified atherosclerotic plaque in the left main, left anterior descending, left circumflex and right coronary arteries. Calcifications of the aortic valve (mild). Mediastinum/Nodes: No pathologically enlarged mediastinal or hilar lymph nodes. Right hilar lymph node has significantly decreased in size, currently measuring only 11 mm in short axis. Esophagus is unremarkable in appearance. No axillary lymphadenopathy. Lungs/Pleura: Some dependent areas of mild subsegmental atelectasis and/or scarring are noted in the lower lobes of the lungs bilaterally. No acute consolidative airspace disease. No pleural effusions. Musculoskeletal: Well-defined sclerotic lesion in the posterior aspect of T1 vertebral body, similar to prior studies, most compatible with a bone island. There are no other aggressive appearing lytic or blastic lesions noted in the visualized portions of the skeleton. CT ABDOMEN PELVIS FINDINGS Hepatobiliary: Multiple well-defined low-attenuation lesions are noted in the liver, similar to prior studies, compatible with simple cysts, measuring up to 3.3 x 4.4 cm. No other suspicious appearing hepatic lesions are noted. No intra or extrahepatic biliary ductal dilatation. Gallbladder is unremarkable in appearance. Pancreas: No pancreatic mass. No pancreatic ductal dilatation. No pancreatic or peripancreatic fluid or inflammatory changes. Spleen: Spleen is enlarged measuring 13.5 x 7.7 x 16.3 cm (estimated splenic volume of 847 mL). Adrenals/Urinary Tract: Bilateral kidneys and bilateral adrenal glands are normal in  appearance. Minimal bilateral perinephric stranding (nonspecific). There is no hydroureteronephrosis. Urinary bladder is normal in appearance. Stomach/Bowel: Normal appearance of the stomach. No pathologic dilatation of small bowel or colon. Inflammatory changes are noted in the sigmoid mesocolon, best appreciated on axial image 96 of series 2. Normal appendix. Vascular/Lymphatic: Aortic atherosclerosis, without evidence of aneurysm or dissection in the abdominal or pelvic vasculature. No lymphadenopathy noted in the abdomen or pelvis. Reproductive: Prostate gland and seminal vesicles are unremarkable in appearance. Other: No significant volume of ascites.  No pneumoperitoneum. Musculoskeletal: There are no aggressive appearing lytic or blastic lesions noted in the visualized portions of the skeleton. IMPRESSION: 1. Persistent splenomegaly. 2. Mediastinal and right hilar lymphadenopathy has regressed. 3. No new sites of disease are noted elsewhere in the chest, abdomen or pelvis. 4. Subtle inflammatory changes in the fat of the sigmoid mesocolon. This is of uncertain etiology and significance. Typically this is seen in the setting of diverticular disease, however, no adjacent colonic diverticulae are noted. Attention on routine imaging follow-up is recommended to ensure resolution. 5. Aortic atherosclerosis, in addition to left main and 3 vessel coronary artery disease. Assessment for potential risk factor modification, dietary therapy or pharmacologic therapy may be warranted, if clinically indicated. 6. There are calcifications of the aortic valve. Echocardiographic correlation for evaluation of potential valvular dysfunction may be warranted if clinically indicated. 7. Additional incidental findings, as above. Aortic Atherosclerosis (ICD10-I70.0). Electronically Signed   By: Vinnie Langton M.D.   On: 10/04/2017 18:44   Ct Abdomen Pelvis W Contrast  Result Date: 10/04/2017 CLINICAL DATA:  77 year old male  with history of lymphoma diagnosed in June 2018. Followup study. EXAM: CT CHEST, ABDOMEN, AND PELVIS WITH CONTRAST TECHNIQUE: Multidetector CT imaging of the chest, abdomen and pelvis was performed following the standard protocol during bolus administration of intravenous contrast. CONTRAST:  123m ISOVUE-300 IOPAMIDOL (ISOVUE-300) INJECTION 61% COMPARISON:  CT the chest, abdomen and pelvis 04/07/2017. FINDINGS: CT CHEST FINDINGS Cardiovascular: Heart size is normal. There is no significant pericardial fluid, thickening or pericardial calcification. There is aortic atherosclerosis, as well  as atherosclerosis of the great vessels of the mediastinum and the coronary arteries, including calcified atherosclerotic plaque in the left main, left anterior descending, left circumflex and right coronary arteries. Calcifications of the aortic valve (mild). Mediastinum/Nodes: No pathologically enlarged mediastinal or hilar lymph nodes. Right hilar lymph node has significantly decreased in size, currently measuring only 11 mm in short axis. Esophagus is unremarkable in appearance. No axillary lymphadenopathy. Lungs/Pleura: Some dependent areas of mild subsegmental atelectasis and/or scarring are noted in the lower lobes of the lungs bilaterally. No acute consolidative airspace disease. No pleural effusions. Musculoskeletal: Well-defined sclerotic lesion in the posterior aspect of T1 vertebral body, similar to prior studies, most compatible with a bone island. There are no other aggressive appearing lytic or blastic lesions noted in the visualized portions of the skeleton. CT ABDOMEN PELVIS FINDINGS Hepatobiliary: Multiple well-defined low-attenuation lesions are noted in the liver, similar to prior studies, compatible with simple cysts, measuring up to 3.3 x 4.4 cm. No other suspicious appearing hepatic lesions are noted. No intra or extrahepatic biliary ductal dilatation. Gallbladder is unremarkable in appearance. Pancreas: No  pancreatic mass. No pancreatic ductal dilatation. No pancreatic or peripancreatic fluid or inflammatory changes. Spleen: Spleen is enlarged measuring 13.5 x 7.7 x 16.3 cm (estimated splenic volume of 847 mL). Adrenals/Urinary Tract: Bilateral kidneys and bilateral adrenal glands are normal in appearance. Minimal bilateral perinephric stranding (nonspecific). There is no hydroureteronephrosis. Urinary bladder is normal in appearance. Stomach/Bowel: Normal appearance of the stomach. No pathologic dilatation of small bowel or colon. Inflammatory changes are noted in the sigmoid mesocolon, best appreciated on axial image 96 of series 2. Normal appendix. Vascular/Lymphatic: Aortic atherosclerosis, without evidence of aneurysm or dissection in the abdominal or pelvic vasculature. No lymphadenopathy noted in the abdomen or pelvis. Reproductive: Prostate gland and seminal vesicles are unremarkable in appearance. Other: No significant volume of ascites.  No pneumoperitoneum. Musculoskeletal: There are no aggressive appearing lytic or blastic lesions noted in the visualized portions of the skeleton. IMPRESSION: 1. Persistent splenomegaly. 2. Mediastinal and right hilar lymphadenopathy has regressed. 3. No new sites of disease are noted elsewhere in the chest, abdomen or pelvis. 4. Subtle inflammatory changes in the fat of the sigmoid mesocolon. This is of uncertain etiology and significance. Typically this is seen in the setting of diverticular disease, however, no adjacent colonic diverticulae are noted. Attention on routine imaging follow-up is recommended to ensure resolution. 5. Aortic atherosclerosis, in addition to left main and 3 vessel coronary artery disease. Assessment for potential risk factor modification, dietary therapy or pharmacologic therapy may be warranted, if clinically indicated. 6. There are calcifications of the aortic valve. Echocardiographic correlation for evaluation of potential valvular dysfunction  may be warranted if clinically indicated. 7. Additional incidental findings, as above. Aortic Atherosclerosis (ICD10-I70.0). Electronically Signed   By: Vinnie Langton M.D.   On: 10/04/2017 18:44    ASSESSMENT & PLAN:  77 y.o. male with  1. 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  Mediastinal and right hilar lymphadenopathy has regressed. No new sites of disease are noted elsewhere in the chest, abdomen or pelvis.  -Discussed the 10/04/17 CT Neck which did not reveal any lymphadenopathy -Discussed the reasons we would begin treatment including constitutional symptoms, worsening blood counts, significant  hepatosplenomegaly -The patient's spleen was described as normal in appearance on the 09/17/16 CT Abdomen/Pelvis. It has now grown to 13.5x7.7x16.3cm (estimated at 8102m) -Discussed that the patient's spleen enlargement meets some criteria for treatment, though his other blood counts have not been affected at this time -Will test for Hep C and Hep B on next visit -Recommend staying up to date with pneumonia shots and annual flu shots with PCP (Prevnar and Pneumovax) -Will continue to follow pt clinically every 3 months and will follow with CT imaging every 6 months    RTC with Dr KIrene Limboin 3 months with labs   All of the patients questions were answered with apparent satisfaction. The patient knows to call the clinic with any problems, questions or concerns.  The total time spent in the appt was 35 minutes and more than 50% was on counseling and direct patient cares.    GSullivan LoneMD MS AAHIVMS [facility]en[facility] CottageCWhittier [facility] BradfordHematology/Oncology Physician CRe[facility] (Office):       [phone](Work cell):  [phone](Fax):            [phone] 10/06/2017 3:58 PM  I, SBaldwin Jamaica am acting as a sEducation administratorfor Dr KIrene Limbo   .I have reviewed the above documentation for accuracy and completeness, and I agree with the above. .Brunetta GeneraMD

## 2017-10-06 NOTE — Telephone Encounter (Signed)
Appointments scheduled AVS/Calendar printed per 7/3 los °

## 2017-12-20 DIAGNOSIS — Z23 Encounter for immunization: Secondary | ICD-10-CM | POA: Diagnosis not present

## 2018-01-03 ENCOUNTER — Inpatient Hospital Stay: Payer: Medicare Other | Attending: Hematology

## 2018-01-03 DIAGNOSIS — C8582 Other specified types of non-Hodgkin lymphoma, intrathoracic lymph nodes: Secondary | ICD-10-CM | POA: Diagnosis not present

## 2018-01-03 LAB — CMP (CANCER CENTER ONLY)
ALT: 18 U/L (ref 0–44)
AST: 18 U/L (ref 15–41)
Albumin: 3.9 g/dL (ref 3.5–5.0)
Alkaline Phosphatase: 75 U/L (ref 38–126)
Anion gap: 6 (ref 5–15)
BUN: 14 mg/dL (ref 8–23)
CO2: 27 mmol/L (ref 22–32)
Calcium: 9 mg/dL (ref 8.9–10.3)
Chloride: 107 mmol/L (ref 98–111)
Creatinine: 1.08 mg/dL (ref 0.61–1.24)
GFR, Est AFR Am: >60 mL/min (ref >60)
GFR, Estimated: >60 mL/min (ref >60)
Glucose, Bld: 95 mg/dL (ref 70–99)
Potassium: 4.5 mmol/L (ref 3.5–5.1)
Sodium: 140 mmol/L (ref 135–145)
Total Bilirubin: 0.5 mg/dL (ref 0.3–1.2)
Total Protein: 6.3 g/dL — ABNORMAL LOW (ref 6.5–8.1)

## 2018-01-03 LAB — CBC WITH DIFFERENTIAL/PLATELET
Basophils Absolute: 0.1 10*3/uL (ref 0.0–0.1)
Basophils Relative: 1 %
Eosinophils Absolute: 0.2 10*3/uL (ref 0.0–0.5)
Eosinophils Relative: 2 %
HCT: 38.8 % (ref 38.4–49.9)
Hemoglobin: 13.2 g/dL (ref 13.0–17.1)
Lymphocytes Relative: 54 %
Lymphs Abs: 5.9 10*3/uL — ABNORMAL HIGH (ref 0.9–3.3)
MCH: 30.8 pg (ref 27.2–33.4)
MCHC: 34.1 g/dL (ref 32.0–36.0)
MCV: 90.4 fL (ref 79.3–98.0)
Monocytes Absolute: 0.6 10*3/uL (ref 0.1–0.9)
Monocytes Relative: 6 %
Neutro Abs: 4 10*3/uL (ref 1.5–6.5)
Neutrophils Relative %: 37 %
Platelets: 161 10*3/uL (ref 140–400)
RBC: 4.29 MIL/uL (ref 4.20–5.82)
RDW: 13.9 % (ref 11.0–14.6)
WBC: 10.8 10*3/uL — ABNORMAL HIGH (ref 4.0–10.3)

## 2018-01-03 LAB — LACTATE DEHYDROGENASE: LDH: 155 U/L (ref 98–192)

## 2018-01-04 LAB — HEPATITIS B SURFACE ANTIGEN: Hepatitis B Surface Ag: NEGATIVE

## 2018-01-04 LAB — HEPATITIS B CORE ANTIBODY, TOTAL: Hep B Core Total Ab: NEGATIVE

## 2018-01-04 LAB — HEPATITIS C ANTIBODY: HCV Ab: <0.1 s/co ratio (ref 0.0–0.9)

## 2018-01-05 NOTE — Progress Notes (Signed)
HEMATOLOGY/ONCOLOGY CONSULTATION NOTE  Date of Service: 01/06/2018  Patient Care Team: [name], MD as PCP - General (Internal Medicine)  CHIEF COMPLAINTS/PURPOSE OF CONSULTATION:  B-Cell Lymphoma   HISTORY OF PRESENTING ILLNESS:   Kyle Sawyer is a wonderful 77 y.o. 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 He is accompanied today by his wife. The pt reports that he is doing well overall.   The pt had a blood flow cytometry on 09/11/16 which revealed a monoclonal B cell population, positive for CD20. A 09/11/16 FISH - CLL Panel was unremarkable save for 2.5% cells showing loss of 13q34 and 2% cells showing loss of p53. The pt has thus far been followed with repeat CT C/A/P every 3 months and last had a CT on 10/04/17 as noted below.   The pt reports that he has not developed any new concerns or symptoms since his last visit with Dr Lebron Conners in April.   The pt denies having ever received a blood transfusion nor has any tattoos.   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   He notes that he had the old shingles vaccine but had a shingles outbreak in December 2018.   Of note prior to the patient's visit today, pt has had CT C/A/P completed on 10/04/17 with results revealing Persistent splenomegaly. 2. Mediastinal and right hilar lymphadenopathy has regressed. 3. No new sites of disease are noted elsewhere in the chest, abdomen or pelvis. 4. Subtle inflammatory changes in the fat of the sigmoid mesocolon. This is of uncertain etiology and significance. Typically this is seen in the setting of diverticular disease, however, no adjacent colonic diverticulae are noted. Attention on routine imaging follow-up is recommended to ensure resolution. 5. Aortic atherosclerosis, in addition to left main and 3 vessel coronary artery disease. Assessment for potential risk factor modification,  dietary therapy or pharmacologic therapy may be warranted, if clinically indicated. 6. There are calcifications of the aortic valve. Echocardiographic correlation for evaluation of potential valvular dysfunction may be warranted if clinically indicated. 7. Additional incidental findings.   The pt also had a CT Soft Tissue Neck on 10/04/17 which revealed No evidence of cervical lymphadenopathy.   Most recent lab results (10/04/17) of CBC w/diff, CMP  is as follows: all values are WNL except for WBC at 11.7k, Lymphs abs at 6.4. LDH 10/04/17 is WNL at 187  On review of systems, pt reports stable energy levels, and denies fevers, chills, night sweats, unexpected weight loss, abdominal pains, chest pain, shortness of breath, leg swelling, and any other symptoms.   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 The patient's last visit with [place] was on 10/06/17. He is accompanied today by his wife. The pt reports that he is doing well overall.   The pt reports that he has not developed any new concerns in the interim. He has not had any constitutional symptoms and has not noticed any new lumps or bumps. The pt also denies any abdominal pains or senses of abdominal fullness.   Lab results (01/03/18) of CBC w/diff, CMP is as follows: all values are WNL except for WBC at 10.8k, Lymphs abs at 5.9k, Total Protein at 6.3. 01/03/18 LDH is WNL at 155  On review of systems, pt reports good energy levels, eating well, and denies abdominal  pains, abdominal distension, fevers, chills, night sweats, changes in bowel habits, noticing any new lumps or bumps, and any other symptoms.   MEDICAL HISTORY:  Past Medical History:  Diagnosis Date  . Adenomatous colon polyp 04/2011  . Allergic rhinitis   . Basal cell carcinoma (BCC) of forehead   . 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 Hemorrhoids   . Hypercholesterolemia   . Lipoma of back     Right scapula  . Lymphocytosis 08/14/2016  . Lymphoma ([place]) dx'd 09/2016  . Obstructive sleep apnea hypopnea, mild   . 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 HEMORRHOID SURGERY    . HERNIA REPAIR     LIH 2007  . SKIN SURGERY     various unspecified    SOCIAL HISTORY: Social History   Socioeconomic History  . Marital status: Married    Spouse name: Not on file  . Number of children: Not on file  . Years of education: Not on file  . Highest education level: Not on file  Occupational History  . Occupation: Retired    Fish farm manager: [employer]  . Financial resource strain: Not on file  . Food insecurity:    Worry: Not on file    Inability: Not on file  . Transportation needs:    Medical: Not on file    Non-medical: Not on file  Tobacco Use  . Smoking status: Current Every Day Smoker    Types: Pipe  . Smokeless tobacco: Never Used  Substance and Sexual Activity  . Alcohol use: No  . Drug use: No  . Sexual activity: Not on file  Lifestyle  . Physical activity:    Days per week: Not on file    Minutes per session: Not on file  . Stress: Not on file  Relationships  . Social connections:    Talks on phone: Not on file    Gets together: Not on file    Attends religious service: Not on file    Active member of club or organization: Not on file    Attends meetings of clubs or organizations: Not on file    Relationship status: Not on file  . Intimate partner violence:    Fear of current or ex partner: Not on file    Emotionally abused: Not on file    Physically abused: Not on file    Forced sexual activity: Not on file  Other Topics Concern  . Not on file  Social History Narrative  . Not on file    FAMILY HISTORY: History reviewed. No pertinent family history.  ALLERGIES:  has No Known Allergies.  MEDICATIONS:  Current Outpatient Medications    Medication Sig Dispense Refill  . atorvastatin (LIPITOR) 10 MG tablet Take 10 mg by mouth daily.    Marland Kitchen aspirin 81 MG tablet Take 81 mg by mouth daily.       No current facility-administered medications for this visit.     REVIEW OF SYSTEMS:    A 10+ POINT REVIEW OF SYSTEMS WAS OBTAINED including neurology, dermatology, psychiatry, cardiac, respiratory, lymph, extremities, GI, GU, Musculoskeletal, constitutional, breasts, reproductive, HEENT.  All pertinent positives are noted in the HPI.  All others are negative.   PHYSICAL EXAMINATION: ECOG PERFORMANCE STATUS: 1 - Symptomatic but completely ambulatory  . Vitals:   01/06/18 0920  BP: 113/62  Pulse: 62  Resp:  18  Temp: 97.7 F (36.5 C)  SpO2: 99%   Filed Weights   01/06/18 0920  Weight: 191 lb 8 oz (86.9 kg)   .Body mass index is 27.48 kg/m.  GENERAL:alert, in no acute distress and comfortable SKIN: no acute rashes, no significant lesions EYES: conjunctiva are pink and non-injected, sclera anicteric OROPHARYNX: MMM, no exudates, no oropharyngeal erythema or ulceration NECK: supple, no JVD LYMPH:  no palpable lymphadenopathy in the cervical, axillary or inguinal regions LUNGS: clear to auscultation b/l with normal respiratory effort HEART: regular rate & rhythm ABDOMEN:  normoactive bowel sounds , non tender, not distended. Spleen just palpable under the LCM Extremity: no pedal edema PSYCH: alert & oriented x 3 with fluent speech NEURO: no focal motor/sensory deficits    LABORATORY DATA:  I have reviewed the data as listed  . CBC Latest Ref Rng & Units 01/03/2018 10/04/2017 07/05/2017  WBC 4.0 - 10.3 K/uL 10.8(H) 11.7(H) 7.3  Hemoglobin 13.0 - 17.1 g/dL 13.2 13.3 12.3(L)  Hematocrit 38.4 - 49.9 % 38.8 39.5 36.6(L)  Platelets 140 - 400 K/uL 161 152 152   . CBC    Component Value Date/Time   WBC 10.8 (H) 01/03/2018 1114   RBC 4.29 01/03/2018 1114   HGB 13.2 01/03/2018 1114   HGB 13.3 10/04/2017 1109   HGB 13.9  04/07/2017 0939   HCT 38.8 01/03/2018 1114   HCT 41.8 04/07/2017 0939   PLT 161 01/03/2018 1114   PLT 152 10/04/2017 1109   PLT 149 04/07/2017 0939   MCV 90.4 01/03/2018 1114   MCV 91.7 04/07/2017 0939   MCH 30.8 01/03/2018 1114   MCHC 34.1 01/03/2018 1114   RDW 13.9 01/03/2018 1114   RDW 15.0 (H) 04/07/2017 0939   LYMPHSABS 5.9 (H) 01/03/2018 1114   LYMPHSABS 6.4 (H) 04/07/2017 0939   MONOABS 0.6 01/03/2018 1114   MONOABS 0.6 04/07/2017 0939   EOSABS 0.2 01/03/2018 1114   EOSABS 0.2 04/07/2017 0939   BASOSABS 0.1 01/03/2018 1114   BASOSABS 0.1 04/07/2017 0939     . CMP Latest Ref Rng & Units 01/03/2018 10/04/2017 07/05/2017  Glucose 70 - 99 mg/dL 95 86 102  BUN 8 - 23 mg/dL _0 Creatinine 0.61 - 1.24 mg/dL 1.08 1.13 1.14  Sodium 135 - 145 mmol/L 140 139 137  Potassium 3.5 - 5.1 mmol/L 4.5 4.1 4.2  Chloride 98 - 111 mmol/L 107 105 106  CO2 22 - 32 mmol/L _1 Calcium 8.9 - 10.3 mg/dL 9.0 8.9 8.9  Total Protein 6.5 - 8.1 g/dL 6.3(L) 6.5 6.0(L)  Total Bilirubin 0.3 - 1.2 mg/dL 0.5 0.7 0.5  Alkaline Phos 38 - 126 U/L 75 78 79  AST 15 - 41 U/L _2 ALT 0 - 44 U/L _3 09/11/16 Molecular Pathology:   09/11/16 Peripheral blood flow cytometry:      RADIOGRAPHIC STUDIES: I have personally reviewed the radiological images as listed and agreed with the findings in the report. No results found.  ASSESSMENT & PLAN:  77 y.o. male with  1. 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  Mediastinal and right hilar lymphadenopathy has regressed. No new sites of disease are noted elsewhere in the chest, abdomen or pelvis.  10/04/17 CT Neck did not reveal any lymphadenopathy   PLAN -Discussed the reasons we  would begin treatment including constitutional symptoms, worsening blood counts, significant hepatosplenomegaly -The patient's spleen  was described as normal in appearance on the 09/17/16 CT Abdomen/Pelvis. It has now grown to 13.5x7.7x16.3cm as of 10/04/17 CT A/P(estimated at 859m) -Previously discussed that the patient's spleen enlargement meets some criteria for treatment, though his other blood counts have not been affected at this time -Will continue to follow pt clinically every 3 months and will follow with CT imaging every 6 months  -Discussed pt labwork from 01/03/18; Lymphs decreased over three months from 6.4k to 5.9k today. Blood chemistries are stable. LDH is normal.  -The pt shows no clinical or lab progression of SMZL at this time. Splenomegaly does not feel further enlarged from last clinical examination.  -No constitutional symptoms -No indication for initiating active treatment at this time.   -Recommended that the pt stay up to date with flu and pneumonia vaccines, he notes he will bring records of his vaccinations from PCP at next visit  -Will see the pt back in 4 months with CT C/A/P, sooner if any new concerns    CT chest/abd in 16 weeks RTC with Dr KIrene Limboin 4 months with labs   All of the patients questions were answered with apparent satisfaction. The patient knows to call the clinic with any problems, questions or concerns.  The total time spent in the appt was 20 minutes and more than 50% was on counseling and direct patient cares.    GSullivan LoneMD MS AAHIVMS SNorth [facility]enton Ridge Specialty HospitalCOr[facility] LPHematology/Oncology Physician COur [facility]s [facility] (Office):       [phone](Work cell):  [phone](Fax):           [phone] 01/06/2018 10:15 AM  .IBarrie Dunker am acting as a sEducation administratorfor Dr. KIrene Limbo .I have reviewed the above documentation for accuracy and completeness, and I agree with the above. .Brunetta GeneraMD

## 2018-01-06 ENCOUNTER — Telehealth: Payer: Self-pay | Admitting: Hematology

## 2018-01-06 ENCOUNTER — Encounter: Payer: Self-pay | Admitting: Hematology

## 2018-01-06 ENCOUNTER — Inpatient Hospital Stay: Payer: Medicare Other | Attending: Hematology and Oncology | Admitting: Hematology

## 2018-01-06 ENCOUNTER — Telehealth: Payer: Self-pay | Admitting: Oncology

## 2018-01-06 VITALS — BP 113/62 | HR 62 | Temp 97.7°F | Resp 18 | Ht 70.0 in | Wt 191.5 lb

## 2018-01-06 DIAGNOSIS — Z7982 Long term (current) use of aspirin: Secondary | ICD-10-CM | POA: Diagnosis not present

## 2018-01-06 DIAGNOSIS — G4730 Sleep apnea, unspecified: Secondary | ICD-10-CM

## 2018-01-06 DIAGNOSIS — Z86010 Personal history of colonic polyps: Secondary | ICD-10-CM | POA: Insufficient documentation

## 2018-01-06 DIAGNOSIS — I700 Atherosclerosis of aorta: Secondary | ICD-10-CM | POA: Diagnosis not present

## 2018-01-06 DIAGNOSIS — D72820 Lymphocytosis (symptomatic): Secondary | ICD-10-CM

## 2018-01-06 DIAGNOSIS — F17210 Nicotine dependence, cigarettes, uncomplicated: Secondary | ICD-10-CM

## 2018-01-06 DIAGNOSIS — R161 Splenomegaly, not elsewhere classified: Secondary | ICD-10-CM | POA: Insufficient documentation

## 2018-01-06 DIAGNOSIS — I2510 Atherosclerotic heart disease of native coronary artery without angina pectoris: Secondary | ICD-10-CM | POA: Insufficient documentation

## 2018-01-06 DIAGNOSIS — Z79899 Other long term (current) drug therapy: Secondary | ICD-10-CM | POA: Insufficient documentation

## 2018-01-06 DIAGNOSIS — E7800 Pure hypercholesterolemia, unspecified: Secondary | ICD-10-CM | POA: Diagnosis not present

## 2018-01-06 DIAGNOSIS — Z85828 Personal history of other malignant neoplasm of skin: Secondary | ICD-10-CM

## 2018-01-06 DIAGNOSIS — C8582 Other specified types of non-Hodgkin lymphoma, intrathoracic lymph nodes: Secondary | ICD-10-CM | POA: Diagnosis not present

## 2018-01-06 DIAGNOSIS — N400 Enlarged prostate without lower urinary tract symptoms: Secondary | ICD-10-CM | POA: Insufficient documentation

## 2018-01-06 NOTE — Telephone Encounter (Signed)
Appts scheduled avs/calendar printed per 10/3 los °

## 2018-03-07 DIAGNOSIS — Z85828 Personal history of other malignant neoplasm of skin: Secondary | ICD-10-CM | POA: Diagnosis not present

## 2018-03-07 DIAGNOSIS — D485 Neoplasm of uncertain behavior of skin: Secondary | ICD-10-CM | POA: Diagnosis not present

## 2018-03-07 DIAGNOSIS — Z23 Encounter for immunization: Secondary | ICD-10-CM | POA: Diagnosis not present

## 2018-03-07 DIAGNOSIS — L814 Other melanin hyperpigmentation: Secondary | ICD-10-CM | POA: Diagnosis not present

## 2018-03-07 DIAGNOSIS — Z87898 Personal history of other specified conditions: Secondary | ICD-10-CM | POA: Diagnosis not present

## 2018-03-07 DIAGNOSIS — L820 Inflamed seborrheic keratosis: Secondary | ICD-10-CM | POA: Diagnosis not present

## 2018-03-07 DIAGNOSIS — L821 Other seborrheic keratosis: Secondary | ICD-10-CM | POA: Diagnosis not present

## 2018-03-07 DIAGNOSIS — Z85831 Personal history of malignant neoplasm of soft tissue: Secondary | ICD-10-CM | POA: Diagnosis not present

## 2018-03-07 DIAGNOSIS — C44329 Squamous cell carcinoma of skin of other parts of face: Secondary | ICD-10-CM | POA: Diagnosis not present

## 2018-03-07 DIAGNOSIS — L570 Actinic keratosis: Secondary | ICD-10-CM | POA: Diagnosis not present

## 2018-03-07 DIAGNOSIS — D225 Melanocytic nevi of trunk: Secondary | ICD-10-CM | POA: Diagnosis not present

## 2018-03-09 DIAGNOSIS — H25012 Cortical age-related cataract, left eye: Secondary | ICD-10-CM | POA: Diagnosis not present

## 2018-03-09 DIAGNOSIS — H2513 Age-related nuclear cataract, bilateral: Secondary | ICD-10-CM | POA: Diagnosis not present

## 2018-03-21 DIAGNOSIS — C44329 Squamous cell carcinoma of skin of other parts of face: Secondary | ICD-10-CM | POA: Diagnosis not present

## 2018-04-01 DIAGNOSIS — J209 Acute bronchitis, unspecified: Secondary | ICD-10-CM | POA: Diagnosis not present

## 2018-04-08 ENCOUNTER — Encounter: Payer: Self-pay | Admitting: Podiatry

## 2018-04-08 ENCOUNTER — Ambulatory Visit (INDEPENDENT_AMBULATORY_CARE_PROVIDER_SITE_OTHER): Payer: Medicare Other | Admitting: Podiatry

## 2018-04-08 DIAGNOSIS — B351 Tinea unguium: Secondary | ICD-10-CM

## 2018-04-08 DIAGNOSIS — M79675 Pain in left toe(s): Secondary | ICD-10-CM

## 2018-04-08 DIAGNOSIS — M79674 Pain in right toe(s): Secondary | ICD-10-CM

## 2018-04-08 NOTE — Progress Notes (Signed)
   Subjective:    Patient ID: Kyle Sawyer, male    DOB: Aug 21, 1940, 78 y.o.   MRN: [id number]  HPI 78 year old male presents the office today for concerns of toenail thickening and discoloration particularly his right big toenail is thick.  He has tried vinegar soaks as well as castor oil without any significant improvement.  He states the nail rubs causing discomfort.  Denies any redness or drainage or any swelling to the toenail sites.  He has no other concerns today.   Review of Systems  All other systems reviewed and are negative.  Past Medical History:  Diagnosis Date  . Adenomatous colon polyp 04/2011  . Allergic rhinitis   . Basal cell carcinoma (BCC) of forehead   . 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 Hemorrhoids   . Hypercholesterolemia   . Lipoma of back    Right scapula  . Lymphocytosis 08/14/2016  . Lymphoma ([place]) dx'd 09/2016  . Obstructive sleep apnea hypopnea, mild   . 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 HEMORRHOID SURGERY    . HERNIA REPAIR     LIH 2007  . SKIN SURGERY     various unspecified     Current Outpatient Medications:  .  Cetirizine HCl (ZYRTEC PO), Take by mouth., Disp: , Rfl:  .  NON FORMULARY, [facility]  ANTIFUNGAL (NAIL)- #1, Disp: , Rfl:  .  aspirin 81 MG tablet, Take 81 mg by mouth daily.  , Disp: , Rfl:  .  atorvastatin (LIPITOR) 10 MG tablet, Take 10 mg by mouth daily., Disp: , Rfl:   No Known Allergies      Objective:   Physical Exam  General: AAO x3, NAD  Dermatological: Nails appear to be hypertrophic, dystrophic with yellow and brown discoloration of the nails.  The right hallux toenail is the most hypertrophic.  There is no edema, erythema, drainage or pus coming from the toenail sites.  No open lesions.  Vascular: Dorsalis Pedis artery and Posterior Tibial artery pedal pulses are 2/4 bilateral with immedate capillary  fill time. PThere is no pain with calf compression, swelling, warmth, erythema.   Neruologic: Grossly intact via light touch bilateral. Protective threshold with Semmes Wienstein monofilament intact to all pedal sites bilateral.   Musculoskeletal: No gross boney pedal deformities bilateral. No pain, crepitus, or limitation noted with foot and ankle range of motion bilateral. Muscular strength 5/5 in all groups tested bilateral.     Assessment & Plan:  78 year old male with symptomatic onychodystrophy, onychomycosis -Treatment options discussed including all alternatives, risks, and complications -Etiology of symptoms were discussed -We discussed treatment for the nails.  We can do a topical antifungal compound cream today through [employer].  We discussed nail removal however would not hold off on this.  Today I sharply debrided the toenails with any complications or bleeding.  Recommend periodic debridement of the nails. -Follow-up in 3 months for nail debridement if needed or sooner if any issues are to arise.  Trula Slade DPM

## 2018-05-03 ENCOUNTER — Inpatient Hospital Stay: Payer: Medicare Other | Attending: Hematology and Oncology

## 2018-05-03 ENCOUNTER — Ambulatory Visit (HOSPITAL_COMMUNITY)
Admission: RE | Admit: 2018-05-03 | Discharge: 2018-05-03 | Disposition: A | Discharge status: Home / Self Care | Payer: Medicare Other | Source: Ambulatory Visit | Attending: Hematology | Admitting: Hematology

## 2018-05-03 DIAGNOSIS — E7800 Pure hypercholesterolemia, unspecified: Secondary | ICD-10-CM | POA: Diagnosis not present

## 2018-05-03 DIAGNOSIS — C8582 Other specified types of non-Hodgkin lymphoma, intrathoracic lymph nodes: Secondary | ICD-10-CM

## 2018-05-03 DIAGNOSIS — Z85828 Personal history of other malignant neoplasm of skin: Secondary | ICD-10-CM | POA: Insufficient documentation

## 2018-05-03 DIAGNOSIS — F17210 Nicotine dependence, cigarettes, uncomplicated: Secondary | ICD-10-CM | POA: Insufficient documentation

## 2018-05-03 DIAGNOSIS — N400 Enlarged prostate without lower urinary tract symptoms: Secondary | ICD-10-CM | POA: Insufficient documentation

## 2018-05-03 DIAGNOSIS — R590 Localized enlarged lymph nodes: Secondary | ICD-10-CM | POA: Diagnosis not present

## 2018-05-03 DIAGNOSIS — Z86010 Personal history of colonic polyps: Secondary | ICD-10-CM | POA: Insufficient documentation

## 2018-05-03 DIAGNOSIS — R161 Splenomegaly, not elsewhere classified: Secondary | ICD-10-CM | POA: Insufficient documentation

## 2018-05-03 DIAGNOSIS — I700 Atherosclerosis of aorta: Secondary | ICD-10-CM | POA: Diagnosis not present

## 2018-05-03 DIAGNOSIS — R911 Solitary pulmonary nodule: Secondary | ICD-10-CM | POA: Diagnosis not present

## 2018-05-03 LAB — CMP (CANCER CENTER ONLY)
ALT: 15 U/L (ref 0–44)
AST: 20 U/L (ref 15–41)
Albumin: 3.9 g/dL (ref 3.5–5.0)
Alkaline Phosphatase: 77 U/L (ref 38–126)
Anion gap: 9 (ref 5–15)
BUN: 14 mg/dL (ref 8–23)
CO2: 27 mmol/L (ref 22–32)
Calcium: 9 mg/dL (ref 8.9–10.3)
Chloride: 106 mmol/L (ref 98–111)
Creatinine: 1.18 mg/dL (ref 0.61–1.24)
GFR, Est AFR Am: >60 mL/min (ref >60)
GFR, Estimated: 60 mL/min — ABNORMAL LOW (ref >60)
Glucose, Bld: 93 mg/dL (ref 70–99)
Potassium: 4.3 mmol/L (ref 3.5–5.1)
Sodium: 142 mmol/L (ref 135–145)
Total Bilirubin: 0.6 mg/dL (ref 0.3–1.2)
Total Protein: 6.5 g/dL (ref 6.5–8.1)

## 2018-05-03 LAB — CBC WITH DIFFERENTIAL/PLATELET
Abs Immature Granulocytes: 0.05 10*3/uL (ref 0.00–0.07)
Basophils Absolute: 0 10*3/uL (ref 0.0–0.1)
Basophils Relative: 0 %
Eosinophils Absolute: 0.1 10*3/uL (ref 0.0–0.5)
Eosinophils Relative: 1 %
HCT: 42.4 % (ref 39.0–52.0)
Hemoglobin: 14 g/dL (ref 13.0–17.0)
Immature Granulocytes: 0 %
Lymphocytes Relative: 52 %
Lymphs Abs: 6.2 10*3/uL — ABNORMAL HIGH (ref 0.7–4.0)
MCH: 30 pg (ref 26.0–34.0)
MCHC: 33 g/dL (ref 30.0–36.0)
MCV: 91 fL (ref 80.0–100.0)
Monocytes Absolute: 0.7 10*3/uL (ref 0.1–1.0)
Monocytes Relative: 6 %
Neutro Abs: 4.9 10*3/uL (ref 1.7–7.7)
Neutrophils Relative %: 41 %
Platelets: 155 10*3/uL (ref 150–400)
RBC: 4.66 MIL/uL (ref 4.22–5.81)
RDW: 13.6 % (ref 11.5–15.5)
WBC: 12 10*3/uL — ABNORMAL HIGH (ref 4.0–10.5)
nRBC: 0 % (ref 0.0–0.2)

## 2018-05-03 LAB — LACTATE DEHYDROGENASE: LDH: 170 U/L (ref 98–192)

## 2018-05-03 MED ORDER — IOHEXOL 300 MG/ML  SOLN
100.0000 mL | Freq: Once | INTRAMUSCULAR | Status: AC | PRN
  Administered 2018-05-03: 100 mL via INTRAVENOUS

## 2018-05-03 MED ORDER — SODIUM CHLORIDE (PF) 0.9 % IJ SOLN
INTRAMUSCULAR | Status: AC
  Filled 2018-05-03: qty 50

## 2018-05-04 NOTE — Progress Notes (Signed)
HEMATOLOGY/ONCOLOGY CLINIC NOTE  Date of Service: 05/05/2018  Patient Care Team: [name], MD as PCP - General (Internal Medicine)  CHIEF COMPLAINTS/PURPOSE OF CONSULTATION:  B-Cell Lymphoma   HISTORY OF PRESENTING ILLNESS:   Kyle Sawyer is a wonderful 78 y.o. 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 He is accompanied today by his wife. The pt reports that he is doing well overall.   The pt had a blood flow cytometry on 09/11/16 which revealed a monoclonal B cell population, positive for CD20. A 09/11/16 FISH - CLL Panel was unremarkable save for 2.5% cells showing loss of 13q34 and 2% cells showing loss of p53. The pt has thus far been followed with repeat CT C/A/P every 3 months and last had a CT on 10/04/17 as noted below.   The pt reports that he has not developed any new concerns or symptoms since his last visit with Dr Lebron Conners in April.   The pt denies having ever received a blood transfusion nor has any tattoos.   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   He notes that he had the old shingles vaccine but had a shingles outbreak in December 2018.   Of note prior to the patient's visit today, pt has had CT C/A/P completed on 10/04/17 with results revealing Persistent splenomegaly. 2. Mediastinal and right hilar lymphadenopathy has regressed. 3. No new sites of disease are noted elsewhere in the chest, abdomen or pelvis. 4. Subtle inflammatory changes in the fat of the sigmoid mesocolon. This is of uncertain etiology and significance. Typically this is seen in the setting of diverticular disease, however, no adjacent colonic diverticulae are noted. Attention on routine imaging follow-up is recommended to ensure resolution. 5. Aortic atherosclerosis, in addition to left main and 3 vessel coronary artery disease. Assessment for potential risk factor modification,  dietary therapy or pharmacologic therapy may be warranted, if clinically indicated. 6. There are calcifications of the aortic valve. Echocardiographic correlation for evaluation of potential valvular dysfunction may be warranted if clinically indicated. 7. Additional incidental findings.   The pt also had a CT Soft Tissue Neck on 10/04/17 which revealed No evidence of cervical lymphadenopathy.   Most recent lab results (10/04/17) of CBC w/diff, CMP  is as follows: all values are WNL except for WBC at 11.7k, Lymphs abs at 6.4. LDH 10/04/17 is WNL at 187  On review of systems, pt reports stable energy levels, and denies fevers, chills, night sweats, unexpected weight loss, abdominal pains, chest pain, shortness of breath, leg swelling, and any other symptoms.   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 The patient's last visit with [place] was on 01/06/2018. He is accompanied today by his wife. The pt reports that he is doing well overall without any issues.   The pt reports that he was recently diagnosed with bronchitis and treated with zithromax that resolved his symptoms. He was not treated with steroids for his bronchitis. He does use OTC zyrtec. He follows up with his PCP on a yearly basis and his next appointment will be in May 2020.   Of note since the patient's last visit, pt has had a CT C/A/P completed on 05/03/18 with results revealing Single mildly enlarged right hilar lymph node and moderate splenomegaly may represent residual lymphoma. A right infrahilar  lymph node which was previously enlarged is no longer enlarged. 2. Other imaging findings of potential clinical significance: Aortic Atherosclerosis. Coronary atherosclerosis. 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 Mild prostatomegaly. Lumbar impingement at L3-4 and L4-5. Right humeral lucency is likely an intraosseous ganglion as shown on prior MRI.  Lab results  today (05/03/18) of CBC w/diff and CMP is as follows: all values are WNL except for WBC at 12 and GFR at 60. LDH WNL.   On review of systems, pt denies fever, chills, weight loss, unexpected weight loss, increased sweating, abdominal pain, and any other symptoms.      MEDICAL HISTORY:  Past Medical History:  Diagnosis Date  . Adenomatous colon polyp 04/2011  . Allergic rhinitis   . Basal cell carcinoma (BCC) of forehead   . 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 Hemorrhoids   . Hypercholesterolemia   . Lipoma of back    Right scapula  . Lymphocytosis 08/14/2016  . Lymphoma ([place]) dx'd 09/2016  . Obstructive sleep apnea hypopnea, mild   . 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 HEMORRHOID SURGERY    . HERNIA REPAIR     LIH 2007  . SKIN SURGERY     various unspecified    SOCIAL HISTORY: Social History   Socioeconomic History  . Marital status: Married    Spouse name: Not on file  . Number of children: Not on file  . Years of education: Not on file  . Highest education level: Not on file  Occupational History  . Occupation: Retired    Fish farm manager: [employer]  . Financial resource strain: Not on file  . Food insecurity:    Worry: Not on file    Inability: Not on file  . Transportation needs:    Medical: Not on file    Non-medical: Not on file  Tobacco Use  . Smoking status: Current Every Day Smoker    Types: Pipe  . Smokeless tobacco: Never Used  Substance and Sexual Activity  . Alcohol use: No  . Drug use: No  . Sexual activity: Not on file  Lifestyle  . Physical activity:    Days per week: Not on file    Minutes per session: Not on file  . Stress: Not on file  Relationships  . Social connections:    Talks on phone: Not on file    Gets together: Not on file    Attends religious service: Not on file    Active member of club  or organization: Not on file    Attends meetings of clubs or organizations: Not on file    Relationship status: Not on file  . Intimate partner violence:    Fear of current or ex partner: Not on file    Emotionally abused: Not on file    Physically abused: Not on file    Forced sexual activity: Not on file  Other Topics Concern  . Not on file  Social History Narrative  . Not on file    FAMILY HISTORY: No family history on file.  ALLERGIES:  has No Known Allergies.  MEDICATIONS:  Current Outpatient Medications  Medication Sig Dispense Refill  . aspirin 81 MG tablet Take 81 mg by mouth daily.      Marland Kitchen atorvastatin (LIPITOR) 10 MG tablet Take 10 mg by mouth daily.    . Cetirizine  HCl (ZYRTEC PO) Take by mouth.    . NON FORMULARY [facility]  ANTIFUNGAL (NAIL)- #1     No current facility-administered medications for this visit.     REVIEW OF SYSTEMS:    A 10+ POINT REVIEW OF SYSTEMS WAS OBTAINED including neurology, dermatology, psychiatry, cardiac, respiratory, lymph, extremities, GI, GU, Musculoskeletal, constitutional, breasts, reproductive, HEENT.  All pertinent positives are noted in the HPI.  All others are negative.   PHYSICAL EXAMINATION: ECOG PERFORMANCE STATUS: 1 - Symptomatic but completely ambulatory  . Vitals:   05/05/18 0925  BP: 116/62  Pulse: 73  Resp: 18  Temp: 97.9 F (36.6 C)  SpO2: 99%   Filed Weights   05/05/18 0925  Weight: 195 lb 3.2 oz (88.5 kg)   .Body mass index is 28.01 kg/m.  GENERAL:alert, in no acute distress and comfortable SKIN: no acute rashes, no significant lesions EYES: conjunctiva are pink and non-injected, sclera anicteric OROPHARYNX: MMM, no exudates, no oropharyngeal erythema or ulceration NECK: supple, no JVD LYMPH:  no palpable lymphadenopathy in the cervical, axillary or inguinal regions LUNGS: clear to auscultation b/l with normal respiratory effort HEART: regular rate & rhythm ABDOMEN:  normoactive bowel  sounds , non tender, not distended. Spleen just palpable under the left costal margin Extremity: no pedal edema PSYCH: alert & oriented x 3 with fluent speech NEURO: no focal motor/sensory deficits   LABORATORY DATA:  I have reviewed the data as listed  . CBC Latest Ref Rng & Units 05/03/2018 01/03/2018 10/04/2017  WBC 4.0 - 10.5 K/uL 12.0(H) 10.8(H) 11.7(H)  Hemoglobin 13.0 - 17.0 g/dL 14.0 13.2 13.3  Hematocrit 39.0 - 52.0 % 42.4 38.8 39.5  Platelets 150 - 400 K/uL 155 161 152   . CBC    Component Value Date/Time   WBC 12.0 (H) 05/03/2018 1052   RBC 4.66 05/03/2018 1052   HGB 14.0 05/03/2018 1052   HGB 13.3 10/04/2017 1109   HGB 13.9 04/07/2017 0939   HCT 42.4 05/03/2018 1052   HCT 41.8 04/07/2017 0939   PLT 155 05/03/2018 1052   PLT 152 10/04/2017 1109   PLT 149 04/07/2017 0939   MCV 91.0 05/03/2018 1052   MCV 91.7 04/07/2017 0939   MCH 30.0 05/03/2018 1052   MCHC 33.0 05/03/2018 1052   RDW 13.6 05/03/2018 1052   RDW 15.0 (H) 04/07/2017 0939   LYMPHSABS 6.2 (H) 05/03/2018 1052   LYMPHSABS 6.4 (H) 04/07/2017 0939   MONOABS 0.7 05/03/2018 1052   MONOABS 0.6 04/07/2017 0939   EOSABS 0.1 05/03/2018 1052   EOSABS 0.2 04/07/2017 0939   BASOSABS 0.0 05/03/2018 1052   BASOSABS 0.1 04/07/2017 0939     . CMP Latest Ref Rng & Units 05/03/2018 01/03/2018 10/04/2017  Glucose 70 - 99 mg/dL 93 95 86  BUN 8 - 23 mg/dL 14 14 15   Creatinine 0.61 - 1.24 mg/dL 1.18 1.08 1.13  Sodium 135 - 145 mmol/L 142 140 139  Potassium 3.5 - 5.1 mmol/L 4.3 4.5 4.1  Chloride 98 - 111 mmol/L 106 107 105  CO2 22 - 32 mmol/L 27 27 27   Calcium 8.9 - 10.3 mg/dL 9.0 9.0 8.9  Total Protein 6.5 - 8.1 g/dL 6.5 6.3(L) 6.5  Total Bilirubin 0.3 - 1.2 mg/dL 0.6 0.5 0.7  Alkaline Phos 38 - 126 U/L 77 75 78  AST 15 - 41 U/L 20 18 24   ALT 0 - 44 U/L 15 18 16    09/11/16 Molecular Pathology:   09/11/16 Peripheral blood flow cytometry:  RADIOGRAPHIC STUDIES: I have personally reviewed the radiological  images as listed and agreed with the findings in the report. Ct Chest W Contrast  Result Date: 05/03/2018 CLINICAL DATA:  Marginal zone non-Hodgkin's lymphoma restaging EXAM: CT CHEST, ABDOMEN, AND PELVIS WITH CONTRAST TECHNIQUE: Multidetector CT imaging of the chest, abdomen and pelvis was performed following the standard protocol during bolus administration of intravenous contrast. CONTRAST:  174m OMNIPAQUE IOHEXOL 300 MG/ML  SOLN COMPARISON:  Multiple exams, including 10/04/2017 FINDINGS: CT CHEST FINDINGS Cardiovascular: Coronary, aortic arch, and branch vessel atherosclerotic vascular disease. Mediastinum/Nodes: Right hilar adenopathy 1.3 cm in short axis on image 39/2, previously the same by my measurements. Additional small mediastinal and hilar lymph nodes are not pathologically enlarged. A prior right enlarged infrahilar node has resolved. Lungs/Pleura: Stable 3 mm nodule, right upper lobe, image 49/6, no change from 09/17/2016. Chronic scarring and ground-glass bands of density in both lower lobes especially in the posterior basal segments. Musculoskeletal: Lucent lesion in the right humeral head. Lower cervical spondylosis. Bone island in the T1 vertebral body. Lower thoracic spondylosis and degenerative disc disease, T8 through T12. CT ABDOMEN PELVIS FINDINGS Hepatobiliary: Stable hepatic cysts. No new hepatic lesions. Gallbladder unremarkable. Pancreas: Unremarkable Spleen: Spleen measures 12.5 by 6.5 by 16.6 cm (volume = 710 cm^3). Hypodense rim along the inferior splenic capsule as before. There is only subtle internal heterogeneity in the spleen but without a discrete mass identified. Adrenals/Urinary Tract: Single small hypodense renal lesions are likely cysts but technically too small to characterize. The adrenal glands in urinary bladder appear unremarkable. Stomach/Bowel: Unremarkable Vascular/Lymphatic: Aortoiliac atherosclerotic vascular disease. A right retrocrural lymph node measures 0.8  cm in short axis on image 64/2, stable. No pathologic adenopathy observed in the abdomen/pelvis. Reproductive: Mild prostatomegaly. Calcifications along the posterior portion of the central zone of the prostate gland. Other: Subtle mesenteric stranding in the upper pelvis on image 115/5, improved from prior, likely representing effective treatment. Musculoskeletal: 0.8 cm degenerative anterolisthesis at L4-5 similar to the prior exam. Spondylosis and degenerative disc disease contribute to impingement at L3-4 and L4-5 bilaterally. IMPRESSION: 1. Single mildly enlarged right hilar lymph node and moderate splenomegaly may represent residual lymphoma. A right infrahilar lymph node which was previously enlarged is no longer enlarged. 2. Other imaging findings of potential clinical significance: Aortic Atherosclerosis (ICD10-I70.0). Coronary atherosclerosis. 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 Mild prostatomegaly. Lumbar impingement at L3-4 and L4-5. Right humeral lucency is likely an intraosseous ganglion as shown on prior MRI. Electronically Signed   By: WVan ClinesM.D.   On: 05/03/2018 16:01   Ct Abdomen Pelvis W Contrast  Result Date: 05/03/2018 CLINICAL DATA:  Marginal zone non-Hodgkin's lymphoma restaging EXAM: CT CHEST, ABDOMEN, AND PELVIS WITH CONTRAST TECHNIQUE: Multidetector CT imaging of the chest, abdomen and pelvis was performed following the standard protocol during bolus administration of intravenous contrast. CONTRAST:  1023mOMNIPAQUE IOHEXOL 300 MG/ML  SOLN COMPARISON:  Multiple exams, including 10/04/2017 FINDINGS: CT CHEST FINDINGS Cardiovascular: Coronary, aortic arch, and branch vessel atherosclerotic vascular disease. Mediastinum/Nodes: Right hilar adenopathy 1.3 cm in short axis on image 39/2, previously the same by my measurements. Additional small mediastinal and hilar lymph nodes are not pathologically enlarged. A prior right enlarged infrahilar node has  resolved. Lungs/Pleura: Stable 3 mm nodule, right upper lobe, image 49/6, no change from 09/17/2016. Chronic scarring and ground-glass bands of density in both lower lobes especially in the posterior basal segments. Musculoskeletal: Lucent lesion in the right humeral head. Lower cervical spondylosis. Bone  island in the T1 vertebral body. Lower thoracic spondylosis and degenerative disc disease, T8 through T12. CT ABDOMEN PELVIS FINDINGS Hepatobiliary: Stable hepatic cysts. No new hepatic lesions. Gallbladder unremarkable. Pancreas: Unremarkable Spleen: Spleen measures 12.5 by 6.5 by 16.6 cm (volume = 710 cm^3). Hypodense rim along the inferior splenic capsule as before. There is only subtle internal heterogeneity in the spleen but without a discrete mass identified. Adrenals/Urinary Tract: Single small hypodense renal lesions are likely cysts but technically too small to characterize. The adrenal glands in urinary bladder appear unremarkable. Stomach/Bowel: Unremarkable Vascular/Lymphatic: Aortoiliac atherosclerotic vascular disease. A right retrocrural lymph node measures 0.8 cm in short axis on image 64/2, stable. No pathologic adenopathy observed in the abdomen/pelvis. Reproductive: Mild prostatomegaly. Calcifications along the posterior portion of the central zone of the prostate gland. Other: Subtle mesenteric stranding in the upper pelvis on image 115/5, improved from prior, likely representing effective treatment. Musculoskeletal: 0.8 cm degenerative anterolisthesis at L4-5 similar to the prior exam. Spondylosis and degenerative disc disease contribute to impingement at L3-4 and L4-5 bilaterally. IMPRESSION: 1. Single mildly enlarged right hilar lymph node and moderate splenomegaly may represent residual lymphoma. A right infrahilar lymph node which was previously enlarged is no longer enlarged. 2. Other imaging findings of potential clinical significance: Aortic Atherosclerosis (ICD10-I70.0). Coronary  atherosclerosis. 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 Mild prostatomegaly. Lumbar impingement at L3-4 and L4-5. Right humeral lucency is likely an intraosseous ganglion as shown on prior MRI. Electronically Signed   By: Van Clines M.D.   On: 05/03/2018 16:01    ASSESSMENT & PLAN:  78 y.o. male with  1. 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  Mediastinal and right hilar lymphadenopathy has regressed. No new sites of disease are noted elsewhere in the chest, abdomen or pelvis.  10/04/17 CT Neck did not reveal any lymphadenopathy   Patient's spleen was described as normal in appearance on the 09/17/16 CT Abdomen/Pelvis. It has now grown to 13.5x7.7x16.3cm as of 10/04/17 CT A/P(estimated at 878m)  PLAN -Discussed pt labwork today, 05/03/2018; WBC at 12 and GFR at 60, otherwise CBC, CMP, and LDH WNL.  -Discussed the 05/03/2018 CT C/A/P which revealed Single mildly enlarged right hilar lymph node and moderate splenomegaly may represent residual lymphoma. A right infrahilar lymph node which was previously enlarged is no longer enlarged. 2. Other imaging findings of potential clinical significance: Aortic Atherosclerosis. Coronary atherosclerosis. 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 Mild prostatomegaly. Lumbar impingement at L3-4 and L4-5. Right humeral lucency is likely an intraosseous ganglion as shown on prior MRI. Spleen volume wise appears smaller.  -Discussed the reasons we would begin treatment including constitutional symptoms, worsening blood counts, significant hepatosplenomegaly -Will continue to follow pt clinically every 6 months and will follow with CT imaging every 12 months  -Recommended that the pt stay up to date with flu and pneumonia vaccines, he notes he will bring records of his vaccinations from PCP at next  visit  -Discussed that if the patient has fever, chills, abdominal pain, or abdominal enlargement, then to follow up in the office as soon as possible.  -Will see the pt back in 6 months with labs -Advised that the patient take the Prevnar and Pneumovax with his PCP.   RTC with Dr KIrene Limbowith labs in 6 months  All of the patients questions were answered with apparent satisfaction.  The patient knows to call the clinic with any problems, questions or concerns.  The total time spent in the appt was 20 minutes and more than 50% was on counseling and direct patient cares.    Sullivan Lone MD MS AAHIVMS [facility] Hematology/Oncology Physician [facility]  (Office):       [phone] (Work cell):  [phone] (Fax):           [phone]  05/05/2018 9:32 AM  I, Soijett Blue am acting as scribe for Dr. Sullivan Lone.  .I have reviewed the above documentation for accuracy and completeness, and I agree with the above. Brunetta Genera MD

## 2018-05-05 ENCOUNTER — Inpatient Hospital Stay (HOSPITAL_BASED_OUTPATIENT_CLINIC_OR_DEPARTMENT_OTHER): Payer: Medicare Other | Admitting: Hematology

## 2018-05-05 ENCOUNTER — Telehealth: Payer: Self-pay

## 2018-05-05 VITALS — BP 116/62 | HR 73 | Temp 97.9°F | Resp 18 | Ht 70.0 in | Wt 195.2 lb

## 2018-05-05 DIAGNOSIS — C8582 Other specified types of non-Hodgkin lymphoma, intrathoracic lymph nodes: Secondary | ICD-10-CM

## 2018-05-05 DIAGNOSIS — F17210 Nicotine dependence, cigarettes, uncomplicated: Secondary | ICD-10-CM | POA: Diagnosis not present

## 2018-05-05 DIAGNOSIS — Z86010 Personal history of colonic polyps: Secondary | ICD-10-CM | POA: Diagnosis not present

## 2018-05-05 DIAGNOSIS — E7800 Pure hypercholesterolemia, unspecified: Secondary | ICD-10-CM | POA: Diagnosis not present

## 2018-05-05 DIAGNOSIS — Z85828 Personal history of other malignant neoplasm of skin: Secondary | ICD-10-CM | POA: Diagnosis not present

## 2018-05-05 DIAGNOSIS — I700 Atherosclerosis of aorta: Secondary | ICD-10-CM

## 2018-05-05 DIAGNOSIS — R161 Splenomegaly, not elsewhere classified: Secondary | ICD-10-CM | POA: Diagnosis not present

## 2018-05-05 DIAGNOSIS — N400 Enlarged prostate without lower urinary tract symptoms: Secondary | ICD-10-CM | POA: Diagnosis not present

## 2018-05-05 NOTE — Patient Instructions (Signed)
Thank you for choosing [facility] to provide your oncology and hematology care.  To afford each patient quality time with our providers, please arrive 30 minutes before your scheduled appointment time.  If you arrive late for your appointment, you may be asked to reschedule.  We strive to give you quality time with our providers, and arriving late affects you and other patients whose appointments are after yours.    If you are a no show for multiple scheduled visits, you may be dismissed from the clinic at the providers discretion.     Again, thank you for choosing [facility], our hope is that these requests will decrease the amount of time that you wait before being seen by our physicians.  ______________________________________________________________________   Should you have questions after your visit to the [facility], please contact our office at [phone] between the hours of 8:30 and 4:30 p.m.    Voicemails left after 4:30p.m will not be returned until the following business day.     For prescription refill requests, please have your pharmacy contact us directly.  Please also try to allow 48 hours for prescription requests.     Please contact the scheduling department for questions regarding scheduling.  For scheduling of procedures such as PET scans, CT scans, MRI, Ultrasound, etc please contact central scheduling at [phone].     Resources For Cancer Patients and Caregivers:    Oncolink.org:  A wonderful resource for patients and healthcare providers for information regarding your disease, ways to tract your treatment, what to expect, etc.      American Cancer Society:  800-227-2345  Can help patients locate various types of support and financial assistance   Cancer Care: 1-800-813-HOPE (4673) Provides financial assistance, online support groups, medication/co-pay assistance.     [place] DSS:  336-641-3447 Where to apply  for food stamps, Medicaid, and utility assistance   Medicare Rights Center: 800-333-4114 Helps people with Medicare understand their rights and benefits, navigate the Medicare system, and secure the quality healthcare they deserve   SCAT: 336-333-6589  [employer]'s shared-ride transportation service for eligible riders who have a disability that prevents them from riding the fixed route bus.     For additional information on assistance programs please contact our social worker:   Abigail Elmore:  [phone]  

## 2018-05-05 NOTE — Telephone Encounter (Signed)
Printed avs and calender of upcoming appointment. Per 1/30 los 

## 2018-07-07 ENCOUNTER — Other Ambulatory Visit: Payer: Self-pay

## 2018-07-07 ENCOUNTER — Encounter: Payer: Self-pay | Admitting: Podiatry

## 2018-07-07 ENCOUNTER — Ambulatory Visit (INDEPENDENT_AMBULATORY_CARE_PROVIDER_SITE_OTHER): Payer: Medicare Other | Admitting: Podiatry

## 2018-07-07 VITALS — Temp 97.3°F

## 2018-07-07 DIAGNOSIS — B351 Tinea unguium: Secondary | ICD-10-CM | POA: Diagnosis not present

## 2018-07-11 NOTE — Progress Notes (Signed)
Subjective: 78 y.o. returns the office today for painful, elongated, thickened toenails which they cannot trim themself on the right side in for follow-up evaluation of nail fungus. Denies any redness or drainage around the nails.  He has been applying the compound ointment on the left side.  He has noticed improvement.  Denies any acute changes since last appointment and no new complaints today. Denies any systemic complaints such as fevers, chills, nausea, vomiting.   PCP: [name], MD  Objective: AAO 3, NAD DP/PT pulses palpable, CRT less than 3 seconds Nails hypertrophic, dystrophic, elongated, brittle, discolored 5. There is tenderness overlying the nails 1-5 on the left side.  Overall there is improvement in color to the nails.  There is no surrounding erythema or drainage along the nail sites. No open lesions or pre-ulcerative lesions are identified. No other areas of tenderness bilateral lower extremities. No overlying edema, erythema, increased warmth. No pain with calf compression, swelling, warmth, erythema.  Assessment: Patient presents with symptomatic onychomycosis  Plan: -Treatment options including alternatives, risks, complications were discussed -Nails sharply debrided 5 without complication/bleeding. -Continue with topical antifungal medication for now. -Discussed daily foot inspection. If there are any changes, to call the office immediately.  -Follow-up in 3 months or sooner if any problems are to arise. In the meantime, encouraged to call the office with any questions, concerns, changes symptoms.  Celesta Gentile, DPM

## 2018-09-02 DIAGNOSIS — D696 Thrombocytopenia, unspecified: Secondary | ICD-10-CM | POA: Diagnosis not present

## 2018-09-02 DIAGNOSIS — Z0000 Encounter for general adult medical examination without abnormal findings: Secondary | ICD-10-CM | POA: Diagnosis not present

## 2018-09-02 DIAGNOSIS — Z79899 Other long term (current) drug therapy: Secondary | ICD-10-CM | POA: Diagnosis not present

## 2018-09-02 DIAGNOSIS — E782 Mixed hyperlipidemia: Secondary | ICD-10-CM | POA: Diagnosis not present

## 2018-09-02 DIAGNOSIS — Z1331 Encounter for screening for depression: Secondary | ICD-10-CM | POA: Diagnosis not present

## 2018-09-02 DIAGNOSIS — C8580 Other specified types of non-Hodgkin lymphoma, unspecified site: Secondary | ICD-10-CM | POA: Diagnosis not present

## 2018-09-07 DIAGNOSIS — E782 Mixed hyperlipidemia: Secondary | ICD-10-CM | POA: Diagnosis not present

## 2018-09-07 DIAGNOSIS — Z79899 Other long term (current) drug therapy: Secondary | ICD-10-CM | POA: Diagnosis not present

## 2018-10-06 ENCOUNTER — Other Ambulatory Visit: Payer: Self-pay

## 2018-10-06 ENCOUNTER — Encounter: Payer: Self-pay | Admitting: Podiatry

## 2018-10-06 ENCOUNTER — Ambulatory Visit (INDEPENDENT_AMBULATORY_CARE_PROVIDER_SITE_OTHER): Payer: Medicare Other | Admitting: Podiatry

## 2018-10-06 VITALS — Temp 98.4°F

## 2018-10-06 DIAGNOSIS — M722 Plantar fascial fibromatosis: Secondary | ICD-10-CM

## 2018-10-06 DIAGNOSIS — M79674 Pain in right toe(s): Secondary | ICD-10-CM

## 2018-10-06 DIAGNOSIS — M79675 Pain in left toe(s): Secondary | ICD-10-CM | POA: Diagnosis not present

## 2018-10-06 DIAGNOSIS — B351 Tinea unguium: Secondary | ICD-10-CM | POA: Diagnosis not present

## 2018-10-06 NOTE — Patient Instructions (Signed)

## 2018-10-17 NOTE — Progress Notes (Signed)
Subjective: 78 y.o. returns the office today for painful, elongated, thickened toenails which they cannot trim themself on the right side in for follow-up evaluation of nail fungus.  He has been continuing topical antifungal he thinks this is been helpful.  Denies any swelling or redness or any drainage of the toenail sites.  He also has been having some plantar fasciitis issues in the left foot.  No recent injury or falls.  No swelling.  Pain is intermittent to the bottom of the heel.  Previously was diagnosed with plantar fasciitis and stretching did help.  Denies any systemic complaints such as fevers, chills, nausea, vomiting.   PCP: [name], MD  Objective: AAO 3, NAD DP/PT pulses palpable, CRT less than 3 seconds Nails hypertrophic, dystrophic, elongated, brittle, discolored 5. There is tenderness overlying the nails 1-5 on the left side.  Overall there is improvement in color to the nails.  There is no surrounding erythema or drainage along the nail sites. No open lesions or pre-ulcerative lesions are identified. No significant discomfort on the course or insertion of the plantar fascia.  Plantar fascial overall appears to be intact.  Achilles tendon intact.  No edema, erythema. No other areas of tenderness bilateral lower extremities. No overlying edema, erythema, increased warmth. No pain with calf compression, swelling, warmth, erythema.  Assessment: Patient presents with symptomatic onychomycosis; plantar fasciitis  Plan: -Treatment options including alternatives, risks, complications were discussed -Nails sharply debrided 5 without complication/bleeding. -Continue with topical antifungal medication for now. -Discussed traction, icing exercises for plantar fasciitis.  Discussed shoe modifications and orthotics. Discomfort today mother doubled up on steroid injection. -Follow-up in 3 months or sooner if any problems are to arise. In the meantime, encouraged to call the  office with any questions, concerns, changes symptoms.  Celesta Gentile, DPM

## 2018-10-24 ENCOUNTER — Telehealth: Payer: Self-pay | Admitting: Hematology

## 2018-10-24 NOTE — Telephone Encounter (Signed)
[place] [facility] 7/30 appointments moved from 7/30 to 8/5. Left message. Schedule mailed.

## 2018-11-03 ENCOUNTER — Other Ambulatory Visit: Payer: Medicare Other

## 2018-11-03 ENCOUNTER — Ambulatory Visit: Payer: Medicare Other | Admitting: Hematology

## 2018-11-07 NOTE — Progress Notes (Signed)
HEMATOLOGY/ONCOLOGY CLINIC NOTE  Date of Service: 11/07/2018  Patient Care Team: [name], MD as PCP - General (Internal Medicine)  CHIEF COMPLAINTS/PURPOSE OF CONSULTATION:  B-Cell Lymphoma   HISTORY OF PRESENTING ILLNESS:   Kyle Sawyer is a wonderful 78 y.o. 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 He is accompanied today by his wife. The pt reports that he is doing well overall.   The pt had a blood flow cytometry on 09/11/16 which revealed a monoclonal B cell population, positive for CD20. A 09/11/16 FISH - CLL Panel was unremarkable save for 2.5% cells showing loss of 13q34 and 2% cells showing loss of p53. The pt has thus far been followed with repeat CT C/A/P every 3 months and last had a CT on 10/04/17 as noted below.   The pt reports that he has not developed any new concerns or symptoms since his last visit with Dr Lebron Conners in April.   The pt denies having ever received a blood transfusion nor has any tattoos.   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   He notes that he had the old shingles vaccine but had a shingles outbreak in December 2018.   Of note prior to the patient's visit today, pt has had CT C/A/P completed on 10/04/17 with results revealing Persistent splenomegaly. 2. Mediastinal and right hilar lymphadenopathy has regressed. 3. No new sites of disease are noted elsewhere in the chest, abdomen or pelvis. 4. Subtle inflammatory changes in the fat of the sigmoid mesocolon. This is of uncertain etiology and significance. Typically this is seen in the setting of diverticular disease, however, no adjacent colonic diverticulae are noted. Attention on routine imaging follow-up is recommended to ensure resolution. 5. Aortic atherosclerosis, in addition to left main and 3 vessel coronary artery disease. Assessment for potential risk factor modification, dietary  therapy or pharmacologic therapy may be warranted, if clinically indicated. 6. There are calcifications of the aortic valve. Echocardiographic correlation for evaluation of potential valvular dysfunction may be warranted if clinically indicated. 7. Additional incidental findings.   The pt also had a CT Soft Tissue Neck on 10/04/17 which revealed No evidence of cervical lymphadenopathy.   Most recent lab results (10/04/17) of CBC w/diff, CMP  is as follows: all values are WNL except for WBC at 11.7k, Lymphs abs at 6.4. LDH 10/04/17 is WNL at 187  On review of systems, pt reports stable energy levels, and denies fevers, chills, night sweats, unexpected weight loss, abdominal pains, chest pain, shortness of breath, leg swelling, and any other symptoms.   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 The patient's last visit with [place] was on 05/05/2018. The pt reports that he is doing well overall.  The pt reports that he is staying inside and avoiding crowds. Denies fevers, chills, night sweats, fatigue, new skin rashes, and unexpected weight loss. His TG have been elevated so he was put on Fenofibrate 48 mg. He also reports that Atorvastatin 10 mg gave him muscle cramps so he now takes CoQ10.   Lab results today (11/09/2018) of CBC w/diff and CMP is as follows: all values are WNL except for WBC at 12.1k, PLT at 138k, lymphs abs at 7.0k, total protein at 6.2 11/09/2018 LDH at 184  On review of systems, pt reports staying inside,  eating well, sleeping well and denies fevers, chills, night sweats, unexpected weight loss, fatigue, new skin rashes, belly pain, and any other symptoms.   MEDICAL HISTORY:  Past Medical History:  Diagnosis Date  . Adenomatous colon polyp 04/2011  . Allergic rhinitis   . Basal cell carcinoma (BCC) of forehead   . 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 Hemorrhoids   .  Hypercholesterolemia   . Lipoma of back    Right scapula  . Lymphocytosis 08/14/2016  . Lymphoma ([place]) dx'd 09/2016  . Obstructive sleep apnea hypopnea, mild   . 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 HEMORRHOID SURGERY    . HERNIA REPAIR     LIH 2007  . SKIN SURGERY     various unspecified    SOCIAL HISTORY: Social History   Socioeconomic History  . Marital status: Married    Spouse name: Not on file  . Number of children: Not on file  . Years of education: Not on file  . Highest education level: Not on file  Occupational History  . Occupation: Retired    Fish farm manager: [employer]  . Financial resource strain: Not on file  . Food insecurity    Worry: Not on file    Inability: Not on file  . Transportation needs    Medical: Not on file    Non-medical: Not on file  Tobacco Use  . Smoking status: Current Every Day Smoker    Types: Pipe  . Smokeless tobacco: Never Used  Substance and Sexual Activity  . Alcohol use: No  . Drug use: No  . Sexual activity: Not on file  Lifestyle  . Physical activity    Days per week: Not on file    Minutes per session: Not on file  . Stress: Not on file  Relationships  . Social Herbalist on phone: Not on file    Gets together: Not on file    Attends religious service: Not on file    Active member of club or organization: Not on file    Attends meetings of clubs or organizations: Not on file    Relationship status: Not on file  . Intimate partner violence    Fear of current or ex partner: Not on file    Emotionally abused: Not on file    Physically abused: Not on file    Forced sexual activity: Not on file  Other Topics Concern  . Not on file  Social History Narrative  . Not on file    FAMILY HISTORY: No family history on file.  ALLERGIES:  has No Known Allergies.  MEDICATIONS:  Current Outpatient  Medications  Medication Sig Dispense Refill  . aspirin 81 MG tablet Take 81 mg by mouth daily.      Marland Kitchen atorvastatin (LIPITOR) 10 MG tablet Take 10 mg by mouth daily.    . Cetirizine HCl (ZYRTEC PO) Take by mouth.    . NON FORMULARY [facility]  ANTIFUNGAL (NAIL)- #1     No current facility-administered medications for this visit.     REVIEW OF SYSTEMS:    A 10+ POINT REVIEW OF SYSTEMS WAS OBTAINED including neurology, dermatology, psychiatry, cardiac, respiratory, lymph, extremities, GI, GU, Musculoskeletal, constitutional, breasts, reproductive, HEENT.  All pertinent positives are noted in the HPI.  All others are negative.   PHYSICAL EXAMINATION: ECOG  PERFORMANCE STATUS: 1 - Symptomatic but completely ambulatory  . Vitals:   11/09/18 1026  BP: 102/66  Pulse: 61  Resp: 18  Temp: 98.2 F (36.8 C)  SpO2: 99%   Filed Weights   11/09/18 1026  Weight: 193 lb 6.4 oz (87.7 kg)   .Body mass index is 27.75 kg/m.   GENERAL:alert, in no acute distress and comfortable SKIN: no acute rashes, no significant lesions EYES: conjunctiva are pink and non-injected, sclera anicteric OROPHARYNX: MMM, no exudates, no oropharyngeal erythema or ulceration NECK: supple, no JVD LYMPH:  no palpable lymphadenopathy in the cervical, axillary or inguinal regions LUNGS: clear to auscultation b/l with normal respiratory effort HEART: regular rate & rhythm ABDOMEN:  normoactive bowel sounds , non tender, not distended. Spleen just palpable under the left costal margin Extremity: no pedal edema PSYCH: alert & oriented x 3 with fluent speech NEURO: no focal motor/sensory deficits   LABORATORY DATA:  I have reviewed the data as listed  . CBC Latest Ref Rng & Units 11/09/2018 05/03/2018 01/03/2018  WBC 4.0 - 10.5 K/uL 12.1(H) 12.0(H) 10.8(H)  Hemoglobin 13.0 - 17.0 g/dL 13.7 14.0 13.2  Hematocrit 39.0 - 52.0 % 40.2 42.4 38.8  Platelets 150 - 400 K/uL 138(L) 155 161   . CBC    Component  Value Date/Time   WBC 12.1 (H) 11/09/2018 0945   RBC 4.46 11/09/2018 0945   HGB 13.7 11/09/2018 0945   HGB 13.3 10/04/2017 1109   HGB 13.9 04/07/2017 0939   HCT 40.2 11/09/2018 0945   HCT 41.8 04/07/2017 0939   PLT 138 (L) 11/09/2018 0945   PLT 152 10/04/2017 1109   PLT 149 04/07/2017 0939   MCV 90.1 11/09/2018 0945   MCV 91.7 04/07/2017 0939   MCH 30.7 11/09/2018 0945   MCHC 34.1 11/09/2018 0945   RDW 13.5 11/09/2018 0945   RDW 15.0 (H) 04/07/2017 0939   LYMPHSABS 7.0 (H) 11/09/2018 0945   LYMPHSABS 6.4 (H) 04/07/2017 0939   MONOABS 0.6 11/09/2018 0945   MONOABS 0.6 04/07/2017 0939   EOSABS 0.2 11/09/2018 0945   EOSABS 0.2 04/07/2017 0939   BASOSABS 0.1 11/09/2018 0945   BASOSABS 0.1 04/07/2017 0939     . CMP Latest Ref Rng & Units 11/09/2018 05/03/2018 01/03/2018  Glucose 70 - 99 mg/dL 99 93 95  BUN 8 - 23 mg/dL 15 14 14   Creatinine 0.61 - 1.24 mg/dL 1.16 1.18 1.08  Sodium 135 - 145 mmol/L 141 142 140  Potassium 3.5 - 5.1 mmol/L 4.1 4.3 4.5  Chloride 98 - 111 mmol/L 109 106 107  CO2 22 - 32 mmol/L 23 27 27   Calcium 8.9 - 10.3 mg/dL 9.1 9.0 9.0  Total Protein 6.5 - 8.1 g/dL 6.2(L) 6.5 6.3(L)  Total Bilirubin 0.3 - 1.2 mg/dL 0.6 0.6 0.5  Alkaline Phos 38 - 126 U/L 71 77 75  AST 15 - 41 U/L 24 20 18   ALT 0 - 44 U/L 18 15 18    09/11/16 Molecular Pathology:   09/11/16 Peripheral blood flow cytometry:      RADIOGRAPHIC STUDIES: I have personally reviewed the radiological images as listed and agreed with the findings in the report. No results found.  ASSESSMENT & PLAN:   78 y.o. male with  1. 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  Mediastinal and right hilar lymphadenopathy has regressed. No new sites  of disease are noted elsewhere in the chest, abdomen or pelvis.  10/04/17 CT Neck did not reveal any lymphadenopathy   Patient's spleen  was described as normal in appearance on the 09/17/16 CT Abdomen/Pelvis. It has now grown to 13.5x7.7x16.3cm as of 10/04/17 CT A/P(estimated at 872m)  05/03/2018 CT C/A/P which revealed Single mildly enlarged right hilar lymph node and moderate splenomegaly may represent residual lymphoma. A right infrahilar lymph node which was previously enlarged is no longer enlarged. 2. Other imaging findings of potential clinical significance: Aortic Atherosclerosis. Coronary atherosclerosis. 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 Mild prostatomegaly. Lumbar impingement at L3-4 and L4-5. Right humeral lucency is likely an intraosseous ganglion as shown on prior MRI. Spleen volume wise appears smaller.  PLAN -Discussed pt labwork today, 11/09/2018; white counts slightly elevated, blood chemistries are normal, LDH is 184 -no lab or clinical evidence of actionable progression of his NHL at this time. -Discussed the reasons we would begin treatment including constitutional symptoms, worsening blood counts, significant hepatosplenomegaly -Recommended that the pt stay up to date with flu and pneumonia vaccines, he notes he will bring records of his vaccinations from PCP at next visit  -Discussed that if the patient has fever, chills, abdominal pain, or abdominal enlargement, then to follow up in the office as soon as possible.  -Will delay yearly scans and F/U in 6 months with labs   RTC with Dr KIrene Limbowith labs in 6 months   All of the patients questions were answered with apparent satisfaction. The patient knows to call the clinic with any problems, questions or concerns.  The total time spent in the appt was 20 minutes and more than 50% was on counseling and direct patient cares.  GSullivan LoneMD MS AAHIVMS SGrossmont Surgery Center LPCChi Health St. FrancisHematology/Oncology Physician CSout[facility] (Office):       [phone](Work cell):  [phone](Fax):           [phone] 11/07/2018 9:11 PM  I, EDe Burrs am acting as a scribe for Dr. KIrene Limbo .I have reviewed the above documentation for accuracy and completeness, and I agree with the above. .Brunetta GeneraMD

## 2018-11-09 ENCOUNTER — Other Ambulatory Visit: Payer: Self-pay

## 2018-11-09 ENCOUNTER — Telehealth: Payer: Self-pay | Admitting: Hematology

## 2018-11-09 ENCOUNTER — Inpatient Hospital Stay (HOSPITAL_BASED_OUTPATIENT_CLINIC_OR_DEPARTMENT_OTHER): Payer: Medicare Other | Admitting: Hematology

## 2018-11-09 ENCOUNTER — Inpatient Hospital Stay: Payer: Medicare Other | Attending: Hematology

## 2018-11-09 VITALS — BP 102/66 | HR 61 | Temp 98.2°F | Resp 18 | Ht 70.0 in | Wt 193.4 lb

## 2018-11-09 DIAGNOSIS — G4730 Sleep apnea, unspecified: Secondary | ICD-10-CM | POA: Diagnosis not present

## 2018-11-09 DIAGNOSIS — C8582 Other specified types of non-Hodgkin lymphoma, intrathoracic lymph nodes: Secondary | ICD-10-CM

## 2018-11-09 DIAGNOSIS — Z806 Family history of leukemia: Secondary | ICD-10-CM | POA: Insufficient documentation

## 2018-11-09 DIAGNOSIS — I700 Atherosclerosis of aorta: Secondary | ICD-10-CM | POA: Insufficient documentation

## 2018-11-09 DIAGNOSIS — Z7982 Long term (current) use of aspirin: Secondary | ICD-10-CM | POA: Diagnosis not present

## 2018-11-09 DIAGNOSIS — M5134 Other intervertebral disc degeneration, thoracic region: Secondary | ICD-10-CM | POA: Diagnosis not present

## 2018-11-09 DIAGNOSIS — C8512 Unspecified B-cell lymphoma, intrathoracic lymph nodes: Secondary | ICD-10-CM | POA: Insufficient documentation

## 2018-11-09 DIAGNOSIS — E7800 Pure hypercholesterolemia, unspecified: Secondary | ICD-10-CM | POA: Insufficient documentation

## 2018-11-09 DIAGNOSIS — Z79899 Other long term (current) drug therapy: Secondary | ICD-10-CM | POA: Insufficient documentation

## 2018-11-09 DIAGNOSIS — F17210 Nicotine dependence, cigarettes, uncomplicated: Secondary | ICD-10-CM | POA: Diagnosis not present

## 2018-11-09 DIAGNOSIS — Z86010 Personal history of colonic polyps: Secondary | ICD-10-CM | POA: Insufficient documentation

## 2018-11-09 DIAGNOSIS — R161 Splenomegaly, not elsewhere classified: Secondary | ICD-10-CM

## 2018-11-09 DIAGNOSIS — I2510 Atherosclerotic heart disease of native coronary artery without angina pectoris: Secondary | ICD-10-CM | POA: Insufficient documentation

## 2018-11-09 DIAGNOSIS — Z85828 Personal history of other malignant neoplasm of skin: Secondary | ICD-10-CM | POA: Insufficient documentation

## 2018-11-09 DIAGNOSIS — N400 Enlarged prostate without lower urinary tract symptoms: Secondary | ICD-10-CM | POA: Diagnosis not present

## 2018-11-09 LAB — CBC WITH DIFFERENTIAL/PLATELET
Abs Immature Granulocytes: 0.04 10*3/uL (ref 0.00–0.07)
Basophils Absolute: 0.1 10*3/uL (ref 0.0–0.1)
Basophils Relative: 0 %
Eosinophils Absolute: 0.2 10*3/uL (ref 0.0–0.5)
Eosinophils Relative: 2 %
HCT: 40.2 % (ref 39.0–52.0)
Hemoglobin: 13.7 g/dL (ref 13.0–17.0)
Immature Granulocytes: 0 %
Lymphocytes Relative: 59 %
Lymphs Abs: 7 10*3/uL — ABNORMAL HIGH (ref 0.7–4.0)
MCH: 30.7 pg (ref 26.0–34.0)
MCHC: 34.1 g/dL (ref 30.0–36.0)
MCV: 90.1 fL (ref 80.0–100.0)
Monocytes Absolute: 0.6 10*3/uL (ref 0.1–1.0)
Monocytes Relative: 5 %
Neutro Abs: 4.1 10*3/uL (ref 1.7–7.7)
Neutrophils Relative %: 34 %
Platelets: 138 10*3/uL — ABNORMAL LOW (ref 150–400)
RBC: 4.46 MIL/uL (ref 4.22–5.81)
RDW: 13.5 % (ref 11.5–15.5)
WBC: 12.1 10*3/uL — ABNORMAL HIGH (ref 4.0–10.5)
nRBC: 0 % (ref 0.0–0.2)

## 2018-11-09 LAB — CMP (CANCER CENTER ONLY)
ALT: 18 U/L (ref 0–44)
AST: 24 U/L (ref 15–41)
Albumin: 4 g/dL (ref 3.5–5.0)
Alkaline Phosphatase: 71 U/L (ref 38–126)
Anion gap: 9 (ref 5–15)
BUN: 15 mg/dL (ref 8–23)
CO2: 23 mmol/L (ref 22–32)
Calcium: 9.1 mg/dL (ref 8.9–10.3)
Chloride: 109 mmol/L (ref 98–111)
Creatinine: 1.16 mg/dL (ref 0.61–1.24)
GFR, Est AFR Am: >60 mL/min (ref >60)
GFR, Estimated: >60 mL/min (ref >60)
Glucose, Bld: 99 mg/dL (ref 70–99)
Potassium: 4.1 mmol/L (ref 3.5–5.1)
Sodium: 141 mmol/L (ref 135–145)
Total Bilirubin: 0.6 mg/dL (ref 0.3–1.2)
Total Protein: 6.2 g/dL — ABNORMAL LOW (ref 6.5–8.1)

## 2018-11-09 LAB — LACTATE DEHYDROGENASE: LDH: 184 U/L (ref 98–192)

## 2018-11-09 NOTE — Telephone Encounter (Signed)
Scheduled appointments in February per 08/05 los, called patient and patient is notified.

## 2018-12-07 DIAGNOSIS — Z23 Encounter for immunization: Secondary | ICD-10-CM | POA: Diagnosis not present

## 2019-01-05 ENCOUNTER — Other Ambulatory Visit: Payer: Self-pay

## 2019-01-05 ENCOUNTER — Ambulatory Visit (INDEPENDENT_AMBULATORY_CARE_PROVIDER_SITE_OTHER): Payer: Medicare Other | Admitting: Podiatry

## 2019-01-05 DIAGNOSIS — M79674 Pain in right toe(s): Secondary | ICD-10-CM | POA: Diagnosis not present

## 2019-01-05 DIAGNOSIS — B351 Tinea unguium: Secondary | ICD-10-CM | POA: Diagnosis not present

## 2019-01-05 DIAGNOSIS — M79675 Pain in left toe(s): Secondary | ICD-10-CM

## 2019-01-09 NOTE — Progress Notes (Signed)
Subjective: 78 y.o. returns the office today for painful, elongated, thickened toenails which they cannot trim themself on the left side in for follow-up evaluation of nail fungus.  He has been continuing topical antifungal he thinks this is been helpful. He has been using this for 9 months and has been helpful. He is asking how much longer to use it for.  Denies any swelling or redness or any drainage of the toenail sites.  Denies any systemic complaints such as fevers, chills, nausea, vomiting.   PCP: [name], MD  Objective: AAO 3, NAD DP/PT pulses palpable, CRT less than 3 seconds Nails hypertrophic, dystrophic, elongated, brittle, discolored 5. There is tenderness overlying the nails 1-5 on the left side.  Overall there is improvement in color to the nails.  There is no surrounding erythema or drainage along the nail sites. No open lesions or pre-ulcerative lesions are identified. No pain with calf compression, swelling, warmth, erythema.  Assessment: Patient presents with symptomatic onychomycosis  Plan: -Treatment options including alternatives, risks, complications were discussed -Nails sharply debrided 5 without complication/bleeding. -Continue with topical antifungal medication for now. Will do another 3 months in order to use this for one year.  Discomfort today mother doubled up on steroid injection. -Follow-up in 3 months or sooner if any problems are to arise. In the meantime, encouraged to call the office with any questions, concerns, changes symptoms.  Celesta Gentile, DPM

## 2019-03-09 DIAGNOSIS — L821 Other seborrheic keratosis: Secondary | ICD-10-CM | POA: Diagnosis not present

## 2019-03-09 DIAGNOSIS — D225 Melanocytic nevi of trunk: Secondary | ICD-10-CM | POA: Diagnosis not present

## 2019-03-09 DIAGNOSIS — Z87898 Personal history of other specified conditions: Secondary | ICD-10-CM | POA: Diagnosis not present

## 2019-03-09 DIAGNOSIS — Z85831 Personal history of malignant neoplasm of soft tissue: Secondary | ICD-10-CM | POA: Diagnosis not present

## 2019-03-09 DIAGNOSIS — D485 Neoplasm of uncertain behavior of skin: Secondary | ICD-10-CM | POA: Diagnosis not present

## 2019-03-09 DIAGNOSIS — L820 Inflamed seborrheic keratosis: Secondary | ICD-10-CM | POA: Diagnosis not present

## 2019-03-09 DIAGNOSIS — L814 Other melanin hyperpigmentation: Secondary | ICD-10-CM | POA: Diagnosis not present

## 2019-03-09 DIAGNOSIS — Z23 Encounter for immunization: Secondary | ICD-10-CM | POA: Diagnosis not present

## 2019-03-09 DIAGNOSIS — L570 Actinic keratosis: Secondary | ICD-10-CM | POA: Diagnosis not present

## 2019-03-09 DIAGNOSIS — Z85828 Personal history of other malignant neoplasm of skin: Secondary | ICD-10-CM | POA: Diagnosis not present

## 2019-03-27 DIAGNOSIS — D0371 Melanoma in situ of right lower limb, including hip: Secondary | ICD-10-CM | POA: Diagnosis not present

## 2019-04-04 ENCOUNTER — Ambulatory Visit (INDEPENDENT_AMBULATORY_CARE_PROVIDER_SITE_OTHER): Payer: Medicare Other | Admitting: Podiatry

## 2019-04-04 ENCOUNTER — Other Ambulatory Visit: Payer: Self-pay

## 2019-04-04 DIAGNOSIS — M79675 Pain in left toe(s): Secondary | ICD-10-CM | POA: Diagnosis not present

## 2019-04-04 DIAGNOSIS — B351 Tinea unguium: Secondary | ICD-10-CM

## 2019-04-04 NOTE — Progress Notes (Signed)
Subjective: 78 y.o. returns the office today for painful, elongated, thickened toenails which they cannot trim themself on the left side in for follow-up evaluation of nail fungus. He states the nails are doing better and the topical medication has been helpful. Denies any swelling or redness or any drainage of the toenail sites.  Denies any systemic complaints such as fevers, chills, nausea, vomiting.   PCP: [name], MD  Objective: AAO 3, NAD DP/PT pulses palpable, CRT less than 3 seconds Nails hypertrophic, dystrophic, elongated, brittle, discolored 5. There is tenderness overlying the nails 1-5 on the left side.  Overall there is improvement in color to the nails but the distal 1/3 still has yellow discoloration.  There is no surrounding erythema or drainage along the nail sites. No open lesions or pre-ulcerative lesions are identified. No pain with calf compression, swelling, warmth, erythema.  Assessment: Patient presents with symptomatic onychomycosis  Plan: -Treatment options including alternatives, risks, complications were discussed -Nails sharply debrided 5 without complication/bleeding. -Continue with topical antifungal medication for now. Will do another 3 months as it has been helpful but not fully grown out.  Discomfort today mother doubled up on steroid injection. -Follow-up in 3 months or sooner if any problems are to arise. In the meantime, encouraged to call the office with any questions, concerns, changes symptoms.  Celesta Gentile, DPM

## 2019-04-24 ENCOUNTER — Ambulatory Visit: Payer: Medicare Other

## 2019-04-25 ENCOUNTER — Ambulatory Visit: Payer: Medicare Other | Attending: Internal Medicine

## 2019-04-25 DIAGNOSIS — Z23 Encounter for immunization: Secondary | ICD-10-CM | POA: Insufficient documentation

## 2019-04-25 NOTE — Progress Notes (Signed)
   Covid-19 Vaccination Clinic  Name:  Kyle Sawyer    MRN: [id number] DOB: 1940-12-02  04/25/2019  [name] was observed post Covid-19 immunization for 15 minutes without incidence. He was provided with Vaccine Information Sheet and instruction to access the V-Safe system.   [name] was instructed to call 911 with any severe reactions post vaccine: Marland Kitchen Difficulty breathing  . Swelling of your face and throat  . A fast heartbeat  . A bad rash all over your body  . Dizziness and weakness    Immunizations Administered    Name Date Dose VIS Date Route   Pfizer COVID-19 Vaccine 04/25/2019  2:02 PM 0.3 mL 03/17/2019 Intramuscular   Manufacturer: [place]   Lot: [id number]   [place]: [id number]

## 2019-05-12 ENCOUNTER — Other Ambulatory Visit: Payer: Self-pay

## 2019-05-12 ENCOUNTER — Inpatient Hospital Stay (HOSPITAL_BASED_OUTPATIENT_CLINIC_OR_DEPARTMENT_OTHER): Payer: Medicare Other | Admitting: Hematology

## 2019-05-12 ENCOUNTER — Inpatient Hospital Stay: Payer: Medicare Other | Attending: Hematology

## 2019-05-12 VITALS — BP 115/69 | HR 50 | Temp 98.0°F | Resp 18 | Ht 70.0 in | Wt 199.6 lb

## 2019-05-12 DIAGNOSIS — I2510 Atherosclerotic heart disease of native coronary artery without angina pectoris: Secondary | ICD-10-CM | POA: Diagnosis not present

## 2019-05-12 DIAGNOSIS — Z85828 Personal history of other malignant neoplasm of skin: Secondary | ICD-10-CM | POA: Insufficient documentation

## 2019-05-12 DIAGNOSIS — C8582 Other specified types of non-Hodgkin lymphoma, intrathoracic lymph nodes: Secondary | ICD-10-CM

## 2019-05-12 DIAGNOSIS — F17210 Nicotine dependence, cigarettes, uncomplicated: Secondary | ICD-10-CM | POA: Diagnosis not present

## 2019-05-12 DIAGNOSIS — G4730 Sleep apnea, unspecified: Secondary | ICD-10-CM | POA: Diagnosis not present

## 2019-05-12 DIAGNOSIS — Z8589 Personal history of malignant neoplasm of other organs and systems: Secondary | ICD-10-CM | POA: Diagnosis not present

## 2019-05-12 DIAGNOSIS — E7800 Pure hypercholesterolemia, unspecified: Secondary | ICD-10-CM | POA: Diagnosis not present

## 2019-05-12 DIAGNOSIS — Z8572 Personal history of non-Hodgkin lymphomas: Secondary | ICD-10-CM | POA: Insufficient documentation

## 2019-05-12 DIAGNOSIS — I700 Atherosclerosis of aorta: Secondary | ICD-10-CM | POA: Insufficient documentation

## 2019-05-12 DIAGNOSIS — N400 Enlarged prostate without lower urinary tract symptoms: Secondary | ICD-10-CM | POA: Diagnosis not present

## 2019-05-12 DIAGNOSIS — Z86010 Personal history of colonic polyps: Secondary | ICD-10-CM | POA: Insufficient documentation

## 2019-05-12 DIAGNOSIS — R161 Splenomegaly, not elsewhere classified: Secondary | ICD-10-CM

## 2019-05-12 LAB — CBC WITH DIFFERENTIAL/PLATELET
Abs Immature Granulocytes: 0.05 10*3/uL (ref 0.00–0.07)
Basophils Absolute: 0.1 10*3/uL (ref 0.0–0.1)
Basophils Relative: 0 %
Eosinophils Absolute: 0.2 10*3/uL (ref 0.0–0.5)
Eosinophils Relative: 1 %
HCT: 40.5 % (ref 39.0–52.0)
Hemoglobin: 13.5 g/dL (ref 13.0–17.0)
Immature Granulocytes: 0 %
Lymphocytes Relative: 58 %
Lymphs Abs: 7.4 10*3/uL — ABNORMAL HIGH (ref 0.7–4.0)
MCH: 30.6 pg (ref 26.0–34.0)
MCHC: 33.3 g/dL (ref 30.0–36.0)
MCV: 91.8 fL (ref 80.0–100.0)
Monocytes Absolute: 0.6 10*3/uL (ref 0.1–1.0)
Monocytes Relative: 5 %
Neutro Abs: 4.5 10*3/uL (ref 1.7–7.7)
Neutrophils Relative %: 36 %
Platelets: 151 10*3/uL (ref 150–400)
RBC: 4.41 MIL/uL (ref 4.22–5.81)
RDW: 13.4 % (ref 11.5–15.5)
WBC: 12.8 10*3/uL — ABNORMAL HIGH (ref 4.0–10.5)
nRBC: 0 % (ref 0.0–0.2)

## 2019-05-12 LAB — CMP (CANCER CENTER ONLY)
ALT: 15 U/L (ref 0–44)
AST: 19 U/L (ref 15–41)
Albumin: 4 g/dL (ref 3.5–5.0)
Alkaline Phosphatase: 63 U/L (ref 38–126)
Anion gap: 9 (ref 5–15)
BUN: 16 mg/dL (ref 8–23)
CO2: 25 mmol/L (ref 22–32)
Calcium: 8.8 mg/dL — ABNORMAL LOW (ref 8.9–10.3)
Chloride: 107 mmol/L (ref 98–111)
Creatinine: 1.17 mg/dL (ref 0.61–1.24)
GFR, Est AFR Am: >60 mL/min (ref >60)
GFR, Estimated: 60 mL/min — ABNORMAL LOW (ref >60)
Glucose, Bld: 95 mg/dL (ref 70–99)
Potassium: 4.5 mmol/L (ref 3.5–5.1)
Sodium: 141 mmol/L (ref 135–145)
Total Bilirubin: 0.6 mg/dL (ref 0.3–1.2)
Total Protein: 6.1 g/dL — ABNORMAL LOW (ref 6.5–8.1)

## 2019-05-12 LAB — LACTATE DEHYDROGENASE: LDH: 171 U/L (ref 98–192)

## 2019-05-12 NOTE — Progress Notes (Signed)
HEMATOLOGY/ONCOLOGY CLINIC NOTE  Date of Service: 05/12/2019  Patient Care Team: [name], MD as PCP - General (Internal Medicine)  CHIEF COMPLAINTS/PURPOSE OF CONSULTATION:  B-Cell Lymphoma   HISTORY OF PRESENTING ILLNESS:   Kyle Sawyer is a wonderful 79 y.o. 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 He is accompanied today by his wife. The pt reports that he is doing well overall.   The pt had a blood flow cytometry on 09/11/16 which revealed a monoclonal B cell population, positive for CD20. A 09/11/16 FISH - CLL Panel was unremarkable save for 2.5% cells showing loss of 13q34 and 2% cells showing loss of p53. The pt has thus far been followed with repeat CT C/A/P every 3 months and last had a CT on 10/04/17 as noted below.   The pt reports that he has not developed any new concerns or symptoms since his last visit with Dr Lebron Conners in April.   The pt denies having ever received a blood transfusion nor has any tattoos.   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   He notes that he had the old shingles vaccine but had a shingles outbreak in December 2018.   Of note prior to the patient's visit today, pt has had CT C/A/P completed on 10/04/17 with results revealing Persistent splenomegaly. 2. Mediastinal and right hilar lymphadenopathy has regressed. 3. No new sites of disease are noted elsewhere in the chest, abdomen or pelvis. 4. Subtle inflammatory changes in the fat of the sigmoid mesocolon. This is of uncertain etiology and significance. Typically this is seen in the setting of diverticular disease, however, no adjacent colonic diverticulae are noted. Attention on routine imaging follow-up is recommended to ensure resolution. 5. Aortic atherosclerosis, in addition to left main and 3 vessel coronary artery disease. Assessment for potential risk factor modification, dietary  therapy or pharmacologic therapy may be warranted, if clinically indicated. 6. There are calcifications of the aortic valve. Echocardiographic correlation for evaluation of potential valvular dysfunction may be warranted if clinically indicated. 7. Additional incidental findings.   The pt also had a CT Soft Tissue Neck on 10/04/17 which revealed No evidence of cervical lymphadenopathy.   Most recent lab results (10/04/17) of CBC w/diff, CMP  is as follows: all values are WNL except for WBC at 11.7k, Lymphs abs at 6.4. LDH 10/04/17 is WNL at 187  On review of systems, pt reports stable energy levels, and denies fevers, chills, night sweats, unexpected weight loss, abdominal pains, chest pain, shortness of breath, leg swelling, and any other symptoms.   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 The patient's last visit with [place] was on 11/09/2018. The pt reports that he is doing well overall.  The pt reports he has been doing well.  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   Lab results today (05/12/19) of CBC w/diff and CMP is as follows: all values are WNL except for WBC at 12.8, Lymphs Abs at 7.4, Calcium at 8.8, total protein at 6.1, GFR Est Non Af Am at 60.   On review of systems, pt reports discomfort around left ribs and denies fevers, chills, night sweats, abdominal pain, leg swelling and any other symptoms.  MEDICAL HISTORY:  Past Medical History:  Diagnosis Date  . Adenomatous colon polyp 04/2011  . Allergic rhinitis   . Basal cell carcinoma (BCC) of forehead   . 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 Hemorrhoids   . Hypercholesterolemia   . Lipoma of back    Right scapula  . Lymphocytosis 08/14/2016  . Lymphoma ([place]) dx'd 09/2016  . Obstructive sleep apnea hypopnea, mild   . 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 HEMORRHOID SURGERY    . HERNIA REPAIR     LIH 2007  . SKIN SURGERY     various unspecified    SOCIAL HISTORY: Social History   Socioeconomic History  . Marital status: Married    Spouse name: Not on file  . Number of children: Not on file  . Years of education: Not on file  . Highest education level: Not on file  Occupational History  . Occupation: Retired    Fish farm manager: [employer]  Tobacco Use  . Smoking status: Current Every Day Smoker    Types: Pipe  . Smokeless tobacco: Never Used  Substance and Sexual Activity  . Alcohol use: No  . Drug use: No  . Sexual activity: Not on file  Other Topics Concern  . Not on file  Social History Narrative  . Not on file   Social Determinants of Health   Financial Resource Strain:   . Difficulty of Paying Living Expenses: Not on file  Food Insecurity:   . Worried About Charity fundraiser in the Last Year: Not on file  . Ran Out of Food in the Last Year: Not on file  Transportation Needs:   . Lack of Transportation (Medical): Not on file  . Lack of Transportation (Non-Medical): Not on file  Physical Activity:   . Days of Exercise per Week: Not on file  . Minutes of Exercise per Session: Not on file  Stress:   . Feeling of Stress : Not on file  Social Connections:   . Frequency of Communication with Friends and Family: Not on file  . Frequency of Social Gatherings with Friends and Family: Not on file  . Attends Religious Services: Not on file  . Active Member of Clubs or Organizations: Not on file  . Attends Archivist Meetings: Not on file  . Marital Status: Not on file  Intimate Partner Violence:   . Fear of Current or Ex-Partner: Not on file  . Emotionally Abused: Not on file  . Physically Abused: Not on file  . Sexually Abused: Not on file    FAMILY HISTORY: No family history on file.  ALLERGIES:  has No  Known Allergies.  MEDICATIONS:  Current Outpatient Medications  Medication Sig Dispense Refill  . atorvastatin (LIPITOR) 10 MG tablet Take 10 mg by mouth daily.    . Cetirizine HCl (ZYRTEC PO) Take by mouth.    . Coenzyme Q10 (COQ10 PO) Take 1 tablet by mouth daily.    . fenofibrate (TRICOR) 48 MG tablet Take 48 mg by mouth daily.    . FENOFIBRATE PO Take 45 mg by mouth daily.    . NON FORMULARY [facility]  ANTIFUNGAL (NAIL)- #1     No current facility-administered medications for this visit.    REVIEW OF SYSTEMS:   A 10+ POINT REVIEW OF SYSTEMS WAS OBTAINED including neurology, dermatology, psychiatry, cardiac, respiratory,  lymph, extremities, GI, GU, Musculoskeletal, constitutional, breasts, reproductive, HEENT.  All pertinent positives are noted in the HPI.  All others are negative.   PHYSICAL EXAMINATION: ECOG FS:2 - Symptomatic, <50% confined to bed  Vitals:   05/12/19 1026  BP: 115/69  Pulse: (!) 50  Resp: 18  Temp: 98 F (36.7 C)  SpO2: 97%   Wt Readings from Last 3 Encounters:  05/12/19 199 lb 9.6 oz (90.5 kg)  11/09/18 193 lb 6.4 oz (87.7 kg)  05/05/18 195 lb 3.2 oz (88.5 kg)   Body mass index is 28.64 kg/m.    GENERAL:alert, in no acute distress and comfortable SKIN: no acute rashes, no significant lesions EYES: conjunctiva are pink and non-injected, sclera anicteric OROPHARYNX: MMM, no exudates, no oropharyngeal erythema or ulceration NECK: supple, no JVD LYMPH:  no palpable lymphadenopathy in the cervical, axillary or inguinal regions LUNGS: clear to auscultation b/l with normal respiratory effort HEART: regular rate & rhythm ABDOMEN:  normoactive bowel sounds , non tender, not distended. Extremity: no pedal edema PSYCH: alert & oriented x 3 with fluent speech NEURO: no focal motor/sensory deficits   LABORATORY DATA:  I have reviewed the data as listed  . CBC Latest Ref Rng & Units 05/12/2019 11/09/2018 05/03/2018  WBC 4.0 - 10.5 K/uL 12.8(H)  12.1(H) 12.0(H)  Hemoglobin 13.0 - 17.0 g/dL 13.5 13.7 14.0  Hematocrit 39.0 - 52.0 % 40.5 40.2 42.4  Platelets 150 - 400 K/uL 151 138(L) 155   . CBC    Component Value Date/Time   WBC 12.8 (H) 05/12/2019 0951   RBC 4.41 05/12/2019 0951   HGB 13.5 05/12/2019 0951   HGB 13.3 10/04/2017 1109   HGB 13.9 04/07/2017 0939   HCT 40.5 05/12/2019 0951   HCT 41.8 04/07/2017 0939   PLT 151 05/12/2019 0951   PLT 152 10/04/2017 1109   PLT 149 04/07/2017 0939   MCV 91.8 05/12/2019 0951   MCV 91.7 04/07/2017 0939   MCH 30.6 05/12/2019 0951   MCHC 33.3 05/12/2019 0951   RDW 13.4 05/12/2019 0951   RDW 15.0 (H) 04/07/2017 0939   LYMPHSABS 7.4 (H) 05/12/2019 0951   LYMPHSABS 6.4 (H) 04/07/2017 0939   MONOABS 0.6 05/12/2019 0951   MONOABS 0.6 04/07/2017 0939   EOSABS 0.2 05/12/2019 0951   EOSABS 0.2 04/07/2017 0939   BASOSABS 0.1 05/12/2019 0951   BASOSABS 0.1 04/07/2017 0939     . CMP Latest Ref Rng & Units 05/12/2019 11/09/2018 05/03/2018  Glucose 70 - 99 mg/dL 95 99 93  BUN 8 - 23 mg/dL _0 Creatinine 0.61 - 1.24 mg/dL 1.17 1.16 1.18  Sodium 135 - 145 mmol/L 141 141 142  Potassium 3.5 - 5.1 mmol/L 4.5 4.1 4.3  Chloride 98 - 111 mmol/L 107 109 106  CO2 22 - 32 mmol/L _1 Calcium 8.9 - 10.3 mg/dL 8.8(L) 9.1 9.0  Total Protein 6.5 - 8.1 g/dL 6.1(L) 6.2(L) 6.5  Total Bilirubin 0.3 - 1.2 mg/dL 0.6 0.6 0.6  Alkaline Phos 38 - 126 U/L 63 71 77  AST 15 - 41 U/L _2 ALT 0 - 44 U/L _3 09/11/16 Molecular Pathology:   09/11/16 Peripheral blood flow cytometry:      RADIOGRAPHIC STUDIES: I have personally reviewed the radiological images as listed and agreed with the findings in the report. No results found.  ASSESSMENT & PLAN:   79 y.o. male with  1. 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  Mediastinal and right hilar  lymphadenopathy has regressed. No new sites of disease are noted elsewhere in the chest, abdomen or pelvis.  10/04/17 CT Neck did not reveal any lymphadenopathy   Patient's spleen was described as normal in appearance on the 09/17/16 CT Abdomen/Pelvis. It has now grown to 13.5x7.7x16.3cm as of 10/04/17 CT A/P(estimated at 846m)  05/03/2018 CT C/A/P which revealed Single mildly enlarged right hilar lymph node and moderate splenomegaly may represent residual lymphoma. A right infrahilar lymph node which was previously enlarged is no longer enlarged. 2. Other imaging findings of potential clinical significance: Aortic Atherosclerosis. Coronary atherosclerosis. 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 Mild prostatomegaly. Lumbar impingement at L3-4 and L4-5. Right humeral lucency is likely an intraosseous ganglion as shown on prior MRI. Spleen volume wise appears smaller.  PLAN -Discussed pt labwork today, 05/12/19; all values are WNL except for WBC at 12.8, Lymphs Abs at 7.4, Calcium at 8.8, total protein at 6.1, GFR Est Non Af Am at 60.  -Discussed COVID-19 vaccine. He has received his first shot.  -Recommended taking 1000-2000 units of vitamin D. -no lab or clinical evidence of lymphoma progression at this time   FOLLOW UP: RTC with Dr.Kapena Hamme in 6 months with labs  The total time spent in the appt was 20 minutes and more than 50% was on counseling and direct patient cares.  All of the patient's questions were answered with apparent satisfaction. The patient knows to call the clinic with any problems, questions or concerns.   GSullivan LoneMD MS AAHIVMS SSpecialty Surgery Center LLCCRay County Memorial HospitalHematology/Oncology Physician CUniversity [facility] (Office):       [phone](Work cell):  [phone](Fax):           [phone] 05/12/2019 9:12 AM  I, AScot Dock am acting as a scribe for Dr. GSullivan Lone   .I have reviewed the above documentation for accuracy and completeness, and I agree with the  above. .Brunetta GeneraMD

## 2019-05-16 ENCOUNTER — Ambulatory Visit: Payer: Medicare Other | Attending: Internal Medicine

## 2019-05-16 ENCOUNTER — Ambulatory Visit: Payer: Medicare Other

## 2019-05-16 DIAGNOSIS — Z23 Encounter for immunization: Secondary | ICD-10-CM | POA: Insufficient documentation

## 2019-05-16 NOTE — Progress Notes (Signed)
   Covid-19 Vaccination Clinic  Name:  Kyle Sawyer    MRN: [id number] DOB: [birth year]  05/16/2019  [name] was observed post Covid-19 immunization for 15 minutes without incidence. He was provided with Vaccine Information Sheet and instruction to access the V-Safe system.   [name] was instructed to call 911 with any severe reactions post vaccine: Marland Kitchen Difficulty breathing  . Swelling of your face and throat  . A fast heartbeat  . A bad rash all over your body  . Dizziness and weakness    Immunizations Administered    Name Date Dose VIS Date Route   Pfizer COVID-19 Vaccine 05/16/2019  8:25 AM 0.3 mL 03/17/2019 Intramuscular   Manufacturer: [place]   Lot: [id number]   [place]: [id number]

## 2019-05-17 ENCOUNTER — Telehealth: Payer: Self-pay | Admitting: Podiatry

## 2019-05-17 NOTE — Telephone Encounter (Signed)
Pt called requesting refill of his anti fungal medication from [place]

## 2019-05-19 ENCOUNTER — Telehealth: Payer: Self-pay | Admitting: *Deleted

## 2019-05-19 NOTE — Telephone Encounter (Signed)
Patient wanted a refill of the nail lacquer at [facility] and sent over the refill today. Lattie Haw

## 2019-09-05 DIAGNOSIS — Z720 Tobacco use: Secondary | ICD-10-CM | POA: Diagnosis not present

## 2019-09-05 DIAGNOSIS — D696 Thrombocytopenia, unspecified: Secondary | ICD-10-CM | POA: Diagnosis not present

## 2019-09-05 DIAGNOSIS — C8580 Other specified types of non-Hodgkin lymphoma, unspecified site: Secondary | ICD-10-CM | POA: Diagnosis not present

## 2019-09-05 DIAGNOSIS — Z79899 Other long term (current) drug therapy: Secondary | ICD-10-CM | POA: Diagnosis not present

## 2019-09-05 DIAGNOSIS — E782 Mixed hyperlipidemia: Secondary | ICD-10-CM | POA: Diagnosis not present

## 2019-09-05 DIAGNOSIS — Z0000 Encounter for general adult medical examination without abnormal findings: Secondary | ICD-10-CM | POA: Diagnosis not present

## 2019-09-05 DIAGNOSIS — Z1389 Encounter for screening for other disorder: Secondary | ICD-10-CM | POA: Diagnosis not present

## 2019-09-05 DIAGNOSIS — H6123 Impacted cerumen, bilateral: Secondary | ICD-10-CM | POA: Diagnosis not present

## 2019-09-05 DIAGNOSIS — I700 Atherosclerosis of aorta: Secondary | ICD-10-CM | POA: Diagnosis not present

## 2019-09-05 DIAGNOSIS — H9012 Conductive hearing loss, unilateral, left ear, with unrestricted hearing on the contralateral side: Secondary | ICD-10-CM | POA: Diagnosis not present

## 2019-09-07 DIAGNOSIS — L814 Other melanin hyperpigmentation: Secondary | ICD-10-CM | POA: Diagnosis not present

## 2019-09-07 DIAGNOSIS — Z85828 Personal history of other malignant neoplasm of skin: Secondary | ICD-10-CM | POA: Diagnosis not present

## 2019-09-07 DIAGNOSIS — Z87898 Personal history of other specified conditions: Secondary | ICD-10-CM | POA: Diagnosis not present

## 2019-09-07 DIAGNOSIS — L720 Epidermal cyst: Secondary | ICD-10-CM | POA: Diagnosis not present

## 2019-09-07 DIAGNOSIS — Z85831 Personal history of malignant neoplasm of soft tissue: Secondary | ICD-10-CM | POA: Diagnosis not present

## 2019-09-07 DIAGNOSIS — D225 Melanocytic nevi of trunk: Secondary | ICD-10-CM | POA: Diagnosis not present

## 2019-09-07 DIAGNOSIS — L821 Other seborrheic keratosis: Secondary | ICD-10-CM | POA: Diagnosis not present

## 2019-09-07 DIAGNOSIS — L578 Other skin changes due to chronic exposure to nonionizing radiation: Secondary | ICD-10-CM | POA: Diagnosis not present

## 2019-11-08 ENCOUNTER — Other Ambulatory Visit: Payer: Self-pay | Admitting: *Deleted

## 2019-11-08 DIAGNOSIS — C8582 Other specified types of non-Hodgkin lymphoma, intrathoracic lymph nodes: Secondary | ICD-10-CM

## 2019-11-08 NOTE — Progress Notes (Signed)
HEMATOLOGY/ONCOLOGY CLINIC NOTE  Date of Service: 11/09/2019  Patient Care Team: [name], MD as PCP - General (Internal Medicine)  CHIEF COMPLAINTS/PURPOSE OF CONSULTATION:  B-Cell Lymphoma   HISTORY OF PRESENTING ILLNESS:   Kyle Sawyer is a wonderful 79 y.o. 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 He is accompanied today by his wife. The pt reports that he is doing well overall.   The pt had a blood flow cytometry on 09/11/16 which revealed a monoclonal B cell population, positive for CD20. A 09/11/16 FISH - CLL Panel was unremarkable save for 2.5% cells showing loss of 13q34 and 2% cells showing loss of p53. The pt has thus far been followed with repeat CT C/A/P every 3 months and last had a CT on 10/04/17 as noted below.   The pt reports that he has not developed any new concerns or symptoms since his last visit with Dr Lebron Conners in April.   The pt denies having ever received a blood transfusion nor has any tattoos.   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   He notes that he had the old shingles vaccine but had a shingles outbreak in December 2018.   Of note prior to the patient's visit today, pt has had CT C/A/P completed on 10/04/17 with results revealing Persistent splenomegaly. 2. Mediastinal and right hilar lymphadenopathy has regressed. 3. No new sites of disease are noted elsewhere in the chest, abdomen or pelvis. 4. Subtle inflammatory changes in the fat of the sigmoid mesocolon. This is of uncertain etiology and significance. Typically this is seen in the setting of diverticular disease, however, no adjacent colonic diverticulae are noted. Attention on routine imaging follow-up is recommended to ensure resolution. 5. Aortic atherosclerosis, in addition to left main and 3 vessel coronary artery disease. Assessment for potential risk factor modification, dietary  therapy or pharmacologic therapy may be warranted, if clinically indicated. 6. There are calcifications of the aortic valve. Echocardiographic correlation for evaluation of potential valvular dysfunction may be warranted if clinically indicated. 7. Additional incidental findings.   The pt also had a CT Soft Tissue Neck on 10/04/17 which revealed No evidence of cervical lymphadenopathy.   Most recent lab results (10/04/17) of CBC w/diff, CMP  is as follows: all values are WNL except for WBC at 11.7k, Lymphs abs at 6.4. LDH 10/04/17 is WNL at 187  On review of systems, pt reports stable energy levels, and denies fevers, chills, night sweats, unexpected weight loss, abdominal pains, chest pain, shortness of breath, leg swelling, and any other symptoms.   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 The patient's last visit with [place] was on 05/12/19. The pt reports that he is doing well overall.  The pt reports he is good. Pt has had no new concerns, or symptoms. He had wellness check in June and was dehydrated so he has been trying to stay more hydrated.   Lab results today (11/09/19) of CBC w/diff and CMP is as follows: all values are WNL except for WBC at 11.1K, Platelets at 145K, Lymph Abs at 6.0K, Glucose at 100, Total Protein at 6.2 11/09/19 of LDH at 163: WNL  On review of systems, pt reports staying hydrated and denies fevers, chills, night sweats, unexpected weight loss, fatigue, new skin rashes, new SOB, abdominal  pains and any other symptoms.   MEDICAL HISTORY:  Past Medical History:  Diagnosis Date  . Adenomatous colon polyp 04/2011  . Allergic rhinitis   . Basal cell carcinoma (BCC) of forehead   . 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 Hemorrhoids   . Hypercholesterolemia   . Lipoma of back    Right scapula  . Lymphocytosis 08/14/2016  . Lymphoma ([place]) dx'd 09/2016  . Obstructive sleep apnea  hypopnea, mild   . 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 HEMORRHOID SURGERY    . HERNIA REPAIR     LIH 2007  . SKIN SURGERY     various unspecified    SOCIAL HISTORY: Social History   Socioeconomic History  . Marital status: Married    Spouse name: Not on file  . Number of children: Not on file  . Years of education: Not on file  . Highest education level: Not on file  Occupational History  . Occupation: Retired    Fish farm manager: [employer]  Tobacco Use  . Smoking status: Current Every Day Smoker    Types: Pipe  . Smokeless tobacco: Never Used  Vaping Use  . Vaping Use: Never used  Substance and Sexual Activity  . Alcohol use: No  . Drug use: No  . Sexual activity: Not on file  Other Topics Concern  . Not on file  Social History Narrative  . Not on file   Social Determinants of Health   Financial Resource Strain:   . Difficulty of Paying Living Expenses:   Food Insecurity:   . Worried About Charity fundraiser in the Last Year:   . Arboriculturist in the Last Year:   Transportation Needs:   . Film/video editor (Medical):   Marland Kitchen Lack of Transportation (Non-Medical):   Physical Activity:   . Days of Exercise per Week:   . Minutes of Exercise per Session:   Stress:   . Feeling of Stress :   Social Connections:   . Frequency of Communication with Friends and Family:   . Frequency of Social Gatherings with Friends and Family:   . Attends Religious Services:   . Active Member of Clubs or Organizations:   . Attends Archivist Meetings:   Marland Kitchen Marital Status:   Intimate Partner Violence:   . Fear of Current or Ex-Partner:   . Emotionally Abused:   Marland Kitchen Physically Abused:   . Sexually Abused:     FAMILY HISTORY: No family history on file.  ALLERGIES:  has No Known Allergies.  MEDICATIONS:  Current Outpatient Medications  Medication Sig Dispense Refill  .  atorvastatin (LIPITOR) 10 MG tablet Take 10 mg by mouth daily.    . Cetirizine HCl (ZYRTEC PO) Take by mouth.    . Coenzyme Q10 (COQ10 PO) Take 1 tablet by mouth daily.    . FENOFIBRATE PO Take 45 mg by mouth daily.     No current facility-administered medications for this visit.    REVIEW OF SYSTEMS:   A 10+ POINT REVIEW OF SYSTEMS WAS OBTAINED including neurology, dermatology, psychiatry, cardiac, respiratory, lymph, extremities, GI, GU, Musculoskeletal, constitutional, breasts, reproductive, HEENT.  All pertinent positives are noted in the HPI.  All others are negative.   PHYSICAL EXAMINATION: ECOG FS:2 - Symptomatic, <50% confined to bed  Vitals:   11/09/19 1033  BP: 121/63  Pulse: Marland Kitchen)  51  Resp: 18  Temp: 97.7 F (36.5 C)  SpO2: 99%   Wt Readings from Last 3 Encounters:  11/09/19 198 lb 1.6 oz (89.9 kg)  05/12/19 199 lb 9.6 oz (90.5 kg)  11/09/18 193 lb 6.4 oz (87.7 kg)   Body mass index is 28.42 kg/m.    GENERAL:alert, in no acute distress and comfortable SKIN: no acute rashes, no significant lesions EYES: conjunctiva are pink and non-injected, sclera anicteric OROPHARYNX: MMM, no exudates, no oropharyngeal erythema or ulceration NECK: supple, no JVD LYMPH:  no palpable lymphadenopathy in the cervical, axillary or inguinal regions LUNGS: clear to auscultation b/l with normal respiratory effort HEART: regular rate & rhythm ABDOMEN:  normoactive bowel sounds , non tender, not distended. Extremity: no pedal edema PSYCH: alert & oriented x 3 with fluent speech NEURO: no focal motor/sensory deficits  LABORATORY DATA:  I have reviewed the data as listed  . CBC Latest Ref Rng & Units 11/09/2019 05/12/2019 11/09/2018  WBC 4.0 - 10.5 K/uL 11.1(H) 12.8(H) 12.1(H)  Hemoglobin 13.0 - 17.0 g/dL 13.5 13.5 13.7  Hematocrit 39 - 52 % 39.7 40.5 40.2  Platelets 150 - 400 K/uL 145(L) 151 138(L)   . CBC    Component Value Date/Time   WBC 11.1 (H) 11/09/2019 1018   WBC 12.8 (H)  05/12/2019 0951   RBC 4.39 11/09/2019 1018   HGB 13.5 11/09/2019 1018   HGB 13.9 04/07/2017 0939   HCT 39.7 11/09/2019 1018   HCT 41.8 04/07/2017 0939   PLT 145 (L) 11/09/2019 1018   PLT 149 04/07/2017 0939   MCV 90.4 11/09/2019 1018   MCV 91.7 04/07/2017 0939   MCH 30.8 11/09/2019 1018   MCHC 34.0 11/09/2019 1018   RDW 13.5 11/09/2019 1018   RDW 15.0 (H) 04/07/2017 0939   LYMPHSABS 6.0 (H) 11/09/2019 1018   LYMPHSABS 6.4 (H) 04/07/2017 0939   MONOABS 0.6 11/09/2019 1018   MONOABS 0.6 04/07/2017 0939   EOSABS 0.3 11/09/2019 1018   EOSABS 0.2 04/07/2017 0939   BASOSABS 0.1 11/09/2019 1018   BASOSABS 0.1 04/07/2017 0939     . CMP Latest Ref Rng & Units 11/09/2019 05/12/2019 11/09/2018  Glucose 70 - 99 mg/dL 100(H) 95 99  BUN 8 - 23 mg/dL _0 Creatinine 0.61 - 1.24 mg/dL 1.10 1.17 1.16  Sodium 135 - 145 mmol/L 138 141 141  Potassium 3.5 - 5.1 mmol/L 4.3 4.5 4.1  Chloride 98 - 111 mmol/L 107 107 109  CO2 22 - 32 mmol/L _1 Calcium 8.9 - 10.3 mg/dL 9.3 8.8(L) 9.1  Total Protein 6.5 - 8.1 g/dL 6.2(L) 6.1(L) 6.2(L)  Total Bilirubin 0.3 - 1.2 mg/dL 0.6 0.6 0.6  Alkaline Phos 38 - 126 U/L 62 63 71  AST 15 - 41 U/L _2 ALT 0 - 44 U/L _3 09/11/16 Molecular Pathology:   09/11/16 Peripheral blood flow cytometry:      RADIOGRAPHIC STUDIES: I have personally reviewed the radiological images as listed and agreed with the findings in the report. No results found.  ASSESSMENT & PLAN:   79 y.o. male with  1. 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  Mediastinal and right hilar lymphadenopathy has regressed. No new sites of disease are noted elsewhere in the chest, abdomen or pelvis.  10/04/17 CT Neck did not  reveal any lymphadenopathy   Patient's spleen was described as normal in appearance on the 09/17/16 CT Abdomen/Pelvis. It has  now grown to 13.5x7.7x16.3cm as of 10/04/17 CT A/P(estimated at 811m)  05/03/2018 CT C/A/P which revealed Single mildly enlarged right hilar lymph node and moderate splenomegaly may represent residual lymphoma. A right infrahilar lymph node which was previously enlarged is no longer enlarged. 2. Other imaging findings of potential clinical significance: Aortic Atherosclerosis. Coronary atherosclerosis. 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 Mild prostatomegaly. Lumbar impingement at L3-4 and L4-5. Right humeral lucency is likely an intraosseous ganglion as shown on prior MRI. Spleen volume wise appears smaller.  PLAN -Discussed pt labwork today, 11/09/19;  of CBC w/diff and CMP is as follows: all values are WNL except for WBC at 11.1K, Platelets at 145K, Lymph Abs at 6.0K, Glucose at 100, Total Protein at 6.2 -Discussed 11/09/19 of LDH at 163: WNL -Advised continue watching blood counts and scans  -Pt has no new symptoms, or blood count changes  -Recommended taking 1000-2000 units of vitamin D. -Recommends staying hydrated  -No lab or clinical evidence of lymphoma progression at this time -Will see back sooner if new symptoms arise  -Will see back in 6 months  FOLLOW UP: RTC with Dr KIrene Limbowith labs in 6 months  The total time spent in the appt was 30 minutes and more than 50% was on counseling and direct patient cares.  All of the patient's questions were answered with apparent satisfaction. The patient knows to call the clinic with any problems, questions or concerns.  GSullivan LoneMD MS AAHIVMS SMary Lanning Memorial HospitalCDelta Regional Medical Center - West CampusHematology/Oncology Physician CSouthern [facility] (Office):       [phone](Work cell):  [phone](Fax):           [phone] 11/09/2019 11:39 AM  I, EDawayne Cirriam acting as a sEducation administratorfor Dr. GSullivan Lone   .I have reviewed the above documentation for accuracy and completeness, and I agree with the above. .Brunetta GeneraMD

## 2019-11-09 ENCOUNTER — Inpatient Hospital Stay: Payer: Medicare Other | Attending: Hematology | Admitting: Hematology

## 2019-11-09 ENCOUNTER — Inpatient Hospital Stay: Payer: Medicare Other

## 2019-11-09 ENCOUNTER — Other Ambulatory Visit: Payer: Self-pay

## 2019-11-09 VITALS — BP 121/63 | HR 51 | Temp 97.7°F | Resp 18 | Ht 70.0 in | Wt 198.1 lb

## 2019-11-09 DIAGNOSIS — I700 Atherosclerosis of aorta: Secondary | ICD-10-CM | POA: Insufficient documentation

## 2019-11-09 DIAGNOSIS — F17210 Nicotine dependence, cigarettes, uncomplicated: Secondary | ICD-10-CM | POA: Diagnosis not present

## 2019-11-09 DIAGNOSIS — E7800 Pure hypercholesterolemia, unspecified: Secondary | ICD-10-CM | POA: Diagnosis not present

## 2019-11-09 DIAGNOSIS — R161 Splenomegaly, not elsewhere classified: Secondary | ICD-10-CM | POA: Insufficient documentation

## 2019-11-09 DIAGNOSIS — Z8572 Personal history of non-Hodgkin lymphomas: Secondary | ICD-10-CM | POA: Insufficient documentation

## 2019-11-09 DIAGNOSIS — Z79899 Other long term (current) drug therapy: Secondary | ICD-10-CM | POA: Diagnosis not present

## 2019-11-09 DIAGNOSIS — C8582 Other specified types of non-Hodgkin lymphoma, intrathoracic lymph nodes: Secondary | ICD-10-CM

## 2019-11-09 DIAGNOSIS — M5134 Other intervertebral disc degeneration, thoracic region: Secondary | ICD-10-CM | POA: Insufficient documentation

## 2019-11-09 DIAGNOSIS — Z8589 Personal history of malignant neoplasm of other organs and systems: Secondary | ICD-10-CM | POA: Diagnosis not present

## 2019-11-09 DIAGNOSIS — Z85828 Personal history of other malignant neoplasm of skin: Secondary | ICD-10-CM | POA: Diagnosis not present

## 2019-11-09 DIAGNOSIS — G4730 Sleep apnea, unspecified: Secondary | ICD-10-CM | POA: Insufficient documentation

## 2019-11-09 LAB — CMP (CANCER CENTER ONLY)
ALT: 14 U/L (ref 0–44)
AST: 21 U/L (ref 15–41)
Albumin: 4 g/dL (ref 3.5–5.0)
Alkaline Phosphatase: 62 U/L (ref 38–126)
Anion gap: 7 (ref 5–15)
BUN: 12 mg/dL (ref 8–23)
CO2: 24 mmol/L (ref 22–32)
Calcium: 9.3 mg/dL (ref 8.9–10.3)
Chloride: 107 mmol/L (ref 98–111)
Creatinine: 1.1 mg/dL (ref 0.61–1.24)
GFR, Est AFR Am: >60 mL/min (ref >60)
GFR, Estimated: >60 mL/min (ref >60)
Glucose, Bld: 100 mg/dL — ABNORMAL HIGH (ref 70–99)
Potassium: 4.3 mmol/L (ref 3.5–5.1)
Sodium: 138 mmol/L (ref 135–145)
Total Bilirubin: 0.6 mg/dL (ref 0.3–1.2)
Total Protein: 6.2 g/dL — ABNORMAL LOW (ref 6.5–8.1)

## 2019-11-09 LAB — CBC WITH DIFFERENTIAL (CANCER CENTER ONLY)
Abs Immature Granulocytes: 0.04 10*3/uL (ref 0.00–0.07)
Basophils Absolute: 0.1 10*3/uL (ref 0.0–0.1)
Basophils Relative: 1 %
Eosinophils Absolute: 0.3 10*3/uL (ref 0.0–0.5)
Eosinophils Relative: 3 %
HCT: 39.7 % (ref 39.0–52.0)
Hemoglobin: 13.5 g/dL (ref 13.0–17.0)
Immature Granulocytes: 0 %
Lymphocytes Relative: 54 %
Lymphs Abs: 6 10*3/uL — ABNORMAL HIGH (ref 0.7–4.0)
MCH: 30.8 pg (ref 26.0–34.0)
MCHC: 34 g/dL (ref 30.0–36.0)
MCV: 90.4 fL (ref 80.0–100.0)
Monocytes Absolute: 0.6 10*3/uL (ref 0.1–1.0)
Monocytes Relative: 5 %
Neutro Abs: 4.1 10*3/uL (ref 1.7–7.7)
Neutrophils Relative %: 37 %
Platelet Count: 145 10*3/uL — ABNORMAL LOW (ref 150–400)
RBC: 4.39 MIL/uL (ref 4.22–5.81)
RDW: 13.5 % (ref 11.5–15.5)
WBC Count: 11.1 10*3/uL — ABNORMAL HIGH (ref 4.0–10.5)
nRBC: 0 % (ref 0.0–0.2)

## 2019-11-09 LAB — LACTATE DEHYDROGENASE: LDH: 163 U/L (ref 98–192)

## 2019-11-10 ENCOUNTER — Telehealth: Payer: Self-pay | Admitting: Hematology

## 2019-11-10 NOTE — Telephone Encounter (Signed)
Scheduled per los, patient has been called ands voicemail was left.

## 2019-12-29 DIAGNOSIS — Z23 Encounter for immunization: Secondary | ICD-10-CM | POA: Diagnosis not present

## 2019-12-31 IMAGING — CT CT CHEST W/ CM
3 of 9 series · 13 of 36 positions shown, 15 images · IV contrast (iopamidol)
Comparison: CT the chest, abdomen and pelvis 04/07/2017.

CLINICAL DATA: 77-year-old male with history of lymphoma diagnosed
in September 2016. Followup study.

EXAM:
CT CHEST, ABDOMEN, AND PELVIS WITH CONTRAST
TECHNIQUE: Multidetector CT imaging of the chest, abdomen and pelvis was
performed following the standard protocol during bolus
administration of intravenous contrast.
CONTRAST:  100mL FHBZEG-AWW IOPAMIDOL (FHBZEG-AWW) INJECTION 61%

[Series 2: cap with · axial · 0.84mm/px · z∈[-651,-226]mm · 5 of 129 slices shown, 7 images]
[im 22/129  mediastinal]
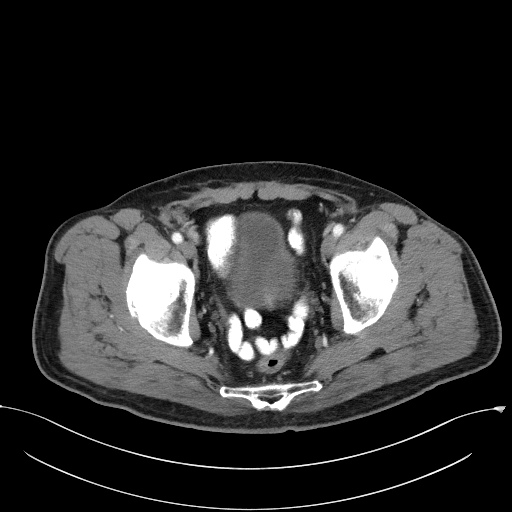
[im 22/129  lung]
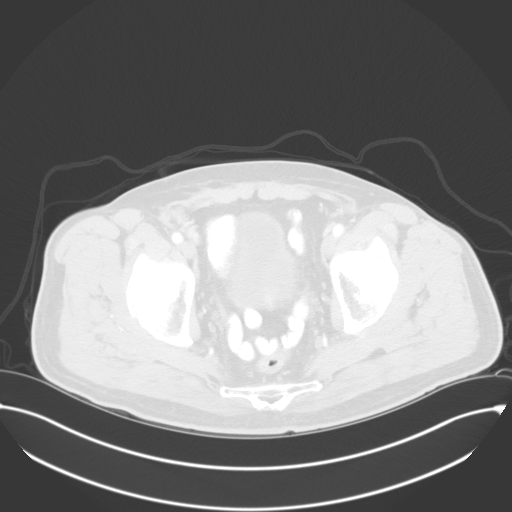
[im 43/129  lung]
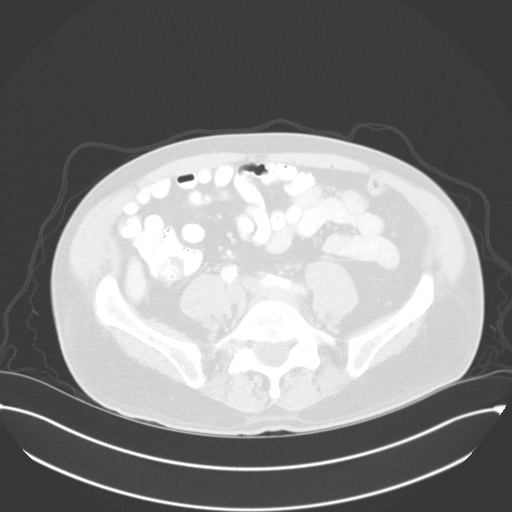
[im 65/129  lung]
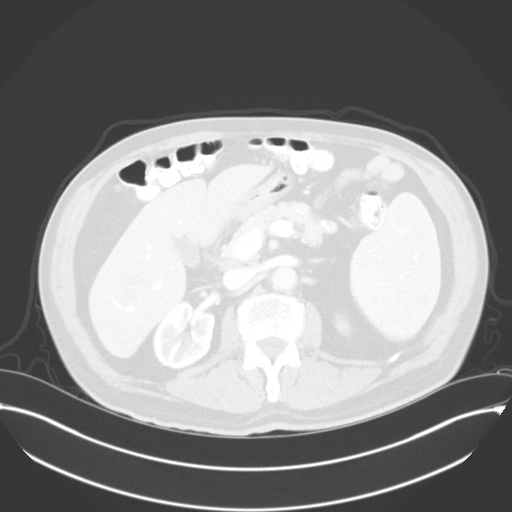
[im 86/129  lung]
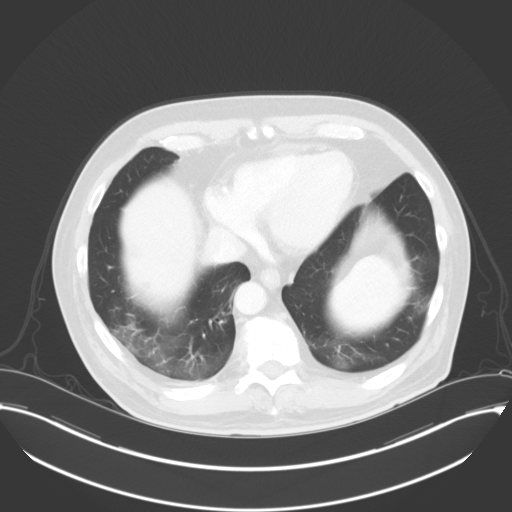
[im 107/129  mediastinal]
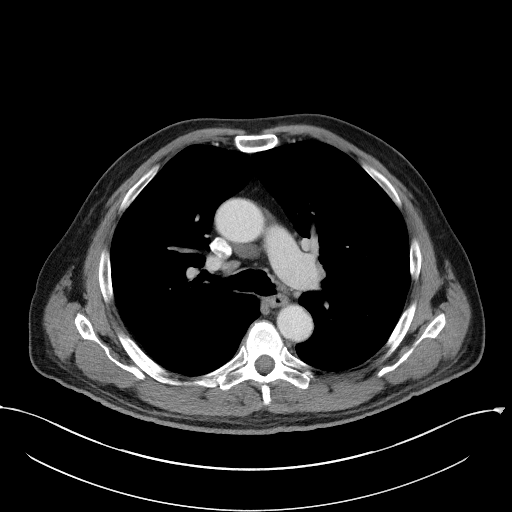
[im 107/129  lung]
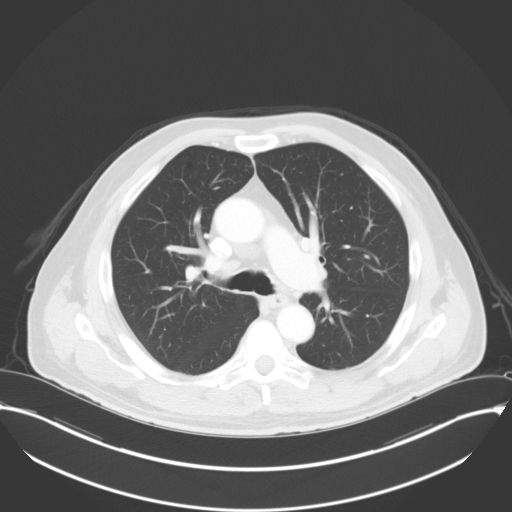

[Series 5: lung · axial · 0.84mm/px · z∈[-356,-196]mm · 5 of 140 slices shown]
[im 20/140  lung]
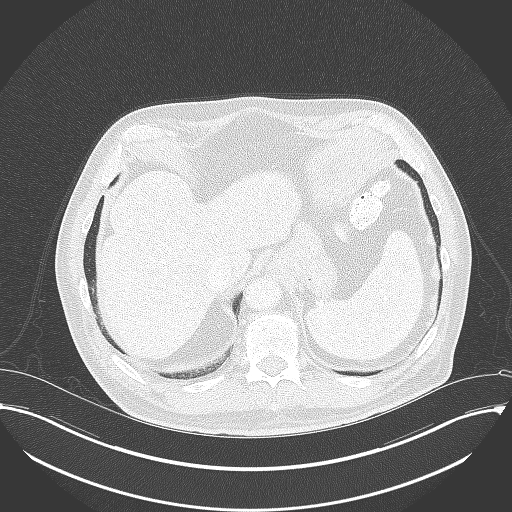
[im 40/140  lung]
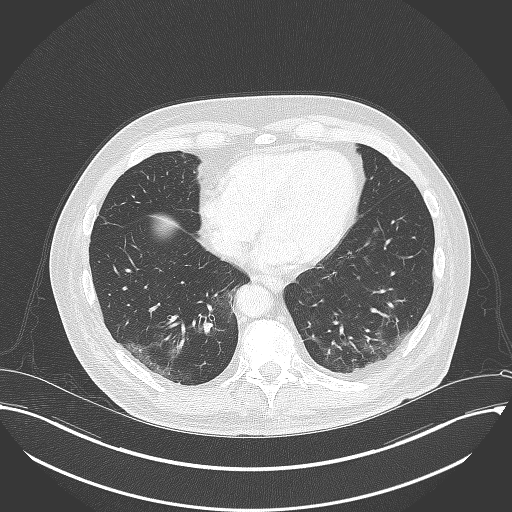
[im 60/140  lung]
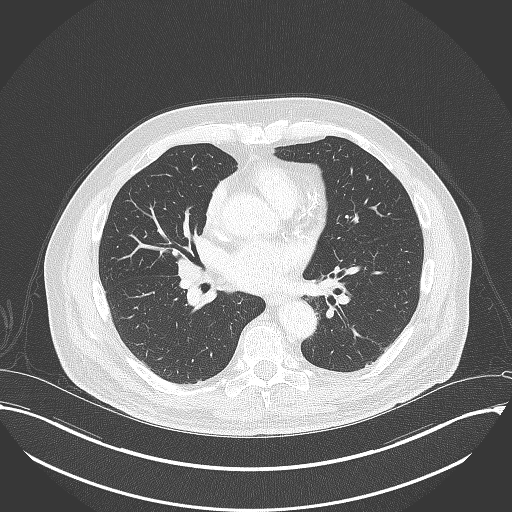
[im 80/140  lung]
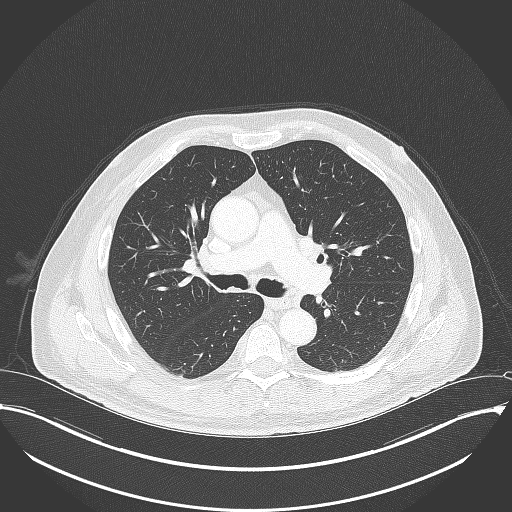
[im 100/140  lung]
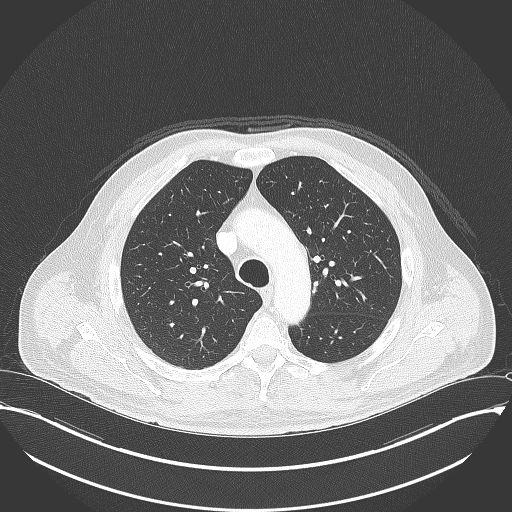

[Series 6: coronals · coronal · 0.83mm/px · 3 of 380 slices shown]
[im 95/380  lung]
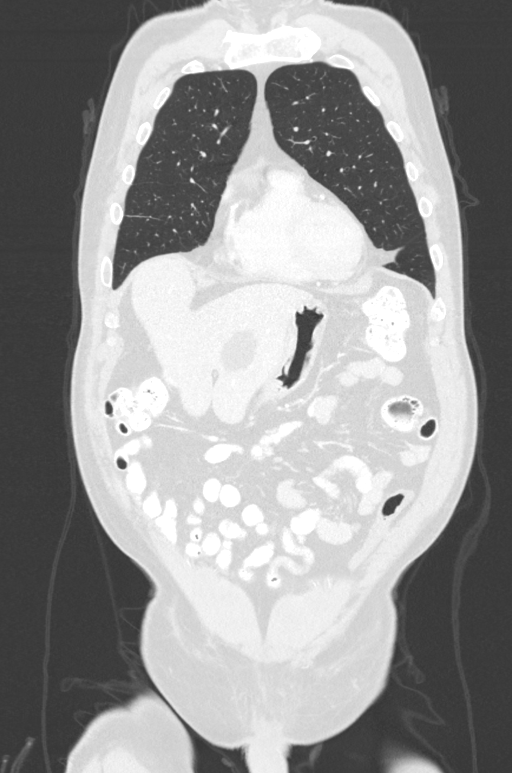
[im 190/380  lung]
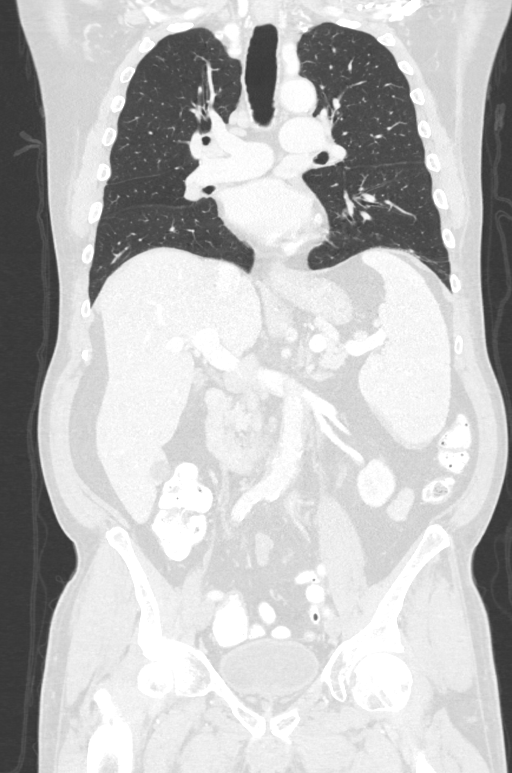
[im 285/380  lung]
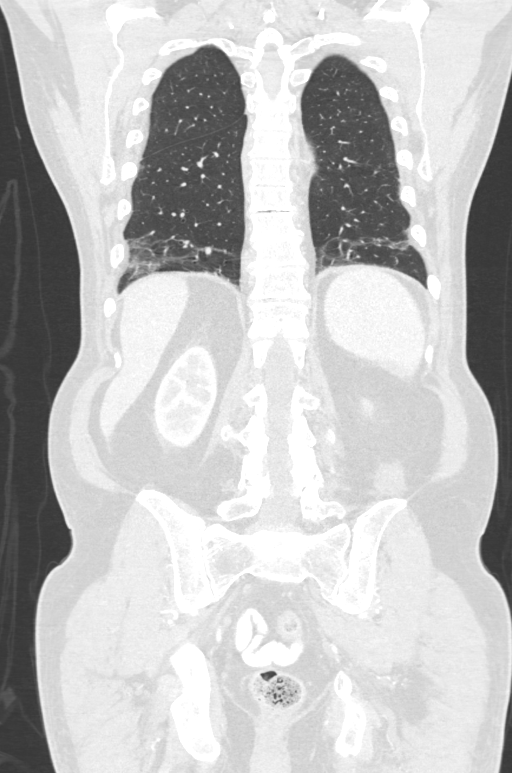

[13 of 36 positions shown; findings below may reference images not displayed]

FINDINGS: CT CHEST FINDINGS

Cardiovascular: Heart size is normal. There is no significant
pericardial fluid, thickening or pericardial calcification. There is
aortic atherosclerosis, as well as atherosclerosis of the great
vessels of the mediastinum and the coronary arteries, including
calcified atherosclerotic plaque in the left main, left anterior
descending, left circumflex and right coronary arteries.
Calcifications of the aortic valve (mild).

Mediastinum/Nodes: No pathologically enlarged mediastinal or hilar
lymph nodes. Right hilar lymph node has significantly decreased in
size, currently measuring only 11 mm in short axis. Esophagus is
unremarkable in appearance. No axillary lymphadenopathy.

Lungs/Pleura: Some dependent areas of mild subsegmental atelectasis
and/or scarring are noted in the lower lobes of the lungs
bilaterally. No acute consolidative airspace disease. No pleural
effusions.

Musculoskeletal: Well-defined sclerotic lesion in the posterior
aspect of T1 vertebral body, similar to prior studies, most
compatible with a bone island. There are no other aggressive
appearing lytic or blastic lesions noted in the visualized portions
of the skeleton.

CT ABDOMEN PELVIS FINDINGS

Hepatobiliary: Multiple well-defined low-attenuation lesions are
noted in the liver, similar to prior studies, compatible with simple
cysts, measuring up to 3.3 x 4.4 cm. No other suspicious appearing
hepatic lesions are noted. No intra or extrahepatic biliary ductal
dilatation. Gallbladder is unremarkable in appearance.

Pancreas: No pancreatic mass. No pancreatic ductal dilatation. No
pancreatic or peripancreatic fluid or inflammatory changes.

Spleen: Spleen is enlarged measuring 13.5 x 7.7 x 16.3 cm (estimated
splenic volume of 847 mL).

Adrenals/Urinary Tract: Bilateral kidneys and bilateral adrenal
glands are normal in appearance. Minimal bilateral perinephric
stranding (nonspecific). There is no hydroureteronephrosis. Urinary
bladder is normal in appearance.

Stomach/Bowel: Normal appearance of the stomach. No pathologic
dilatation of small bowel or colon. Inflammatory changes are noted
in the sigmoid mesocolon, best appreciated on axial image 96 of
series 2. Normal appendix.

Vascular/Lymphatic: Aortic atherosclerosis, without evidence of
aneurysm or dissection in the abdominal or pelvic vasculature. No
lymphadenopathy noted in the abdomen or pelvis.

Reproductive: Prostate gland and seminal vesicles are unremarkable
in appearance.

Other: No significant volume of ascites.  No pneumoperitoneum.

Musculoskeletal: There are no aggressive appearing lytic or blastic
lesions noted in the visualized portions of the skeleton.
IMPRESSION: 1. Persistent splenomegaly.
2. Mediastinal and right hilar lymphadenopathy has regressed.
3. No new sites of disease are noted elsewhere in the chest, abdomen
or pelvis.
4. Subtle inflammatory changes in the fat of the sigmoid mesocolon.
This is of uncertain etiology and significance. Typically this is
seen in the setting of diverticular disease, however, no adjacent
colonic diverticulae are noted. Attention on routine imaging
follow-up is recommended to ensure resolution.
5. Aortic atherosclerosis, in addition to left main and 3 vessel
coronary artery disease. Assessment for potential risk factor
modification, dietary therapy or pharmacologic therapy may be
warranted, if clinically indicated.
6. There are calcifications of the aortic valve. Echocardiographic
correlation for evaluation of potential valvular dysfunction may be
warranted if clinically indicated.
7. Additional incidental findings, as above.

Aortic Atherosclerosis (2HN35-Q24.4).

## 2020-01-01 DIAGNOSIS — Z23 Encounter for immunization: Secondary | ICD-10-CM | POA: Diagnosis not present

## 2020-03-14 DIAGNOSIS — L821 Other seborrheic keratosis: Secondary | ICD-10-CM | POA: Diagnosis not present

## 2020-03-14 DIAGNOSIS — D485 Neoplasm of uncertain behavior of skin: Secondary | ICD-10-CM | POA: Diagnosis not present

## 2020-03-14 DIAGNOSIS — L570 Actinic keratosis: Secondary | ICD-10-CM | POA: Diagnosis not present

## 2020-03-14 DIAGNOSIS — Z87898 Personal history of other specified conditions: Secondary | ICD-10-CM | POA: Diagnosis not present

## 2020-03-14 DIAGNOSIS — D044 Carcinoma in situ of skin of scalp and neck: Secondary | ICD-10-CM | POA: Diagnosis not present

## 2020-03-14 DIAGNOSIS — B079 Viral wart, unspecified: Secondary | ICD-10-CM | POA: Diagnosis not present

## 2020-03-14 DIAGNOSIS — D225 Melanocytic nevi of trunk: Secondary | ICD-10-CM | POA: Diagnosis not present

## 2020-03-14 DIAGNOSIS — L814 Other melanin hyperpigmentation: Secondary | ICD-10-CM | POA: Diagnosis not present

## 2020-03-14 DIAGNOSIS — L578 Other skin changes due to chronic exposure to nonionizing radiation: Secondary | ICD-10-CM | POA: Diagnosis not present

## 2020-03-14 DIAGNOSIS — Z85828 Personal history of other malignant neoplasm of skin: Secondary | ICD-10-CM | POA: Diagnosis not present

## 2020-03-14 DIAGNOSIS — Z85831 Personal history of malignant neoplasm of soft tissue: Secondary | ICD-10-CM | POA: Diagnosis not present

## 2020-04-01 DIAGNOSIS — L570 Actinic keratosis: Secondary | ICD-10-CM | POA: Diagnosis not present

## 2020-04-01 DIAGNOSIS — D044 Carcinoma in situ of skin of scalp and neck: Secondary | ICD-10-CM | POA: Diagnosis not present

## 2020-05-08 NOTE — Progress Notes (Signed)
HEMATOLOGY/ONCOLOGY CLINIC NOTE  Date of Service: 05/09/2020  Patient Care Team: [name], MD as PCP - General (Internal Medicine)  CHIEF COMPLAINTS/PURPOSE OF CONSULTATION:  B-Cell Lymphoma   HISTORY OF PRESENTING ILLNESS:   Kyle Sawyer is a wonderful 80 y.o. 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 He is accompanied today by his wife. The pt reports that he is doing well overall.   The pt had a blood flow cytometry on 09/11/16 which revealed a monoclonal B cell population, positive for CD20. A 09/11/16 FISH - CLL Panel was unremarkable save for 2.5% cells showing loss of 13q34 and 2% cells showing loss of p53. The pt has thus far been followed with repeat CT C/A/P every 3 months and last had a CT on 10/04/17 as noted below.   The pt reports that he has not developed any new concerns or symptoms since his last visit with Dr Lebron Conners in April.   The pt denies having ever received a blood transfusion nor has any tattoos.   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   He notes that he had the old shingles vaccine but had a shingles outbreak in December 2018.   Of note prior to the patient's visit today, pt has had CT C/A/P completed on 10/04/17 with results revealing Persistent splenomegaly. 2. Mediastinal and right hilar lymphadenopathy has regressed. 3. No new sites of disease are noted elsewhere in the chest, abdomen or pelvis. 4. Subtle inflammatory changes in the fat of the sigmoid mesocolon. This is of uncertain etiology and significance. Typically this is seen in the setting of diverticular disease, however, no adjacent colonic diverticulae are noted. Attention on routine imaging follow-up is recommended to ensure resolution. 5. Aortic atherosclerosis, in addition to left main and 3 vessel coronary artery disease. Assessment for potential risk factor modification, dietary  therapy or pharmacologic therapy may be warranted, if clinically indicated. 6. There are calcifications of the aortic valve. Echocardiographic correlation for evaluation of potential valvular dysfunction may be warranted if clinically indicated. 7. Additional incidental findings.   The pt also had a CT Soft Tissue Neck on 10/04/17 which revealed No evidence of cervical lymphadenopathy.   Most recent lab results (10/04/17) of CBC w/diff, CMP  is as follows: all values are WNL except for WBC at 11.7k, Lymphs abs at 6.4. LDH 10/04/17 is WNL at 187  On review of systems, pt reports stable energy levels, and denies fevers, chills, night sweats, unexpected weight loss, abdominal pains, chest pain, shortness of breath, leg swelling, and any other symptoms.   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 The patient's last visit with [place] was on 11/09/19. The pt reports that he is doing well overall.  The pt reports no new symptoms or concerns. He notes no medication changes. The pt has received all his needed vaccines.   Lab results today 05/09/2020 of CBC w/diff and CMP is as follows: all values are WNL except for WBC of 13.4K, Lymphs Abs of 8.1K. 05/09/2020 LDH of 171.  On review of systems, pt  denies fevers, chills, night sweats, infection issues, new lumps/bumps, chest pain, SOB, fatigue and any other symptoms.  MEDICAL HISTORY:  Past Medical History:  Diagnosis Date  . Adenomatous colon polyp 04/2011  . Allergic rhinitis   . Basal  cell carcinoma (BCC) of forehead   . 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 Hemorrhoids   . Hypercholesterolemia   . Lipoma of back    Right scapula  . Lymphocytosis 08/14/2016  . Lymphoma ([place]) dx'd 09/2016  . Obstructive sleep apnea hypopnea, mild   . 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 HEMORRHOID SURGERY    . HERNIA REPAIR     LIH 2007  . SKIN SURGERY     various unspecified    SOCIAL HISTORY: Social History   Socioeconomic History  . Marital status: Married    Spouse name: Not on file  . Number of children: Not on file  . Years of education: Not on file  . Highest education level: Not on file  Occupational History  . Occupation: Retired    Fish farm manager: [employer]  Tobacco Use  . Smoking status: Current Every Day Smoker    Types: Pipe  . Smokeless tobacco: Never Used  Vaping Use  . Vaping Use: Never used  Substance and Sexual Activity  . Alcohol use: No  . Drug use: No  . Sexual activity: Not on file  Other Topics Concern  . Not on file  Social History Narrative  . Not on file   Social Determinants of Health   Financial Resource Strain: Not on file  Food Insecurity: Not on file  Transportation Needs: Not on file  Physical Activity: Not on file  Stress: Not on file  Social Connections: Not on file  Intimate Partner Violence: Not on file    FAMILY HISTORY: No family history on file.  ALLERGIES:  has No Known Allergies.  MEDICATIONS:  Current Outpatient Medications  Medication Sig Dispense Refill  . atorvastatin (LIPITOR) 10 MG tablet Take 10 mg by mouth daily.    . Cetirizine HCl (ZYRTEC PO) Take by mouth.    . Coenzyme Q10 (COQ10 PO) Take 1 tablet by mouth daily.    . FENOFIBRATE PO Take 45 mg by mouth daily.     No current facility-administered medications for this visit.    REVIEW OF SYSTEMS:   10 Point review of Systems was done is negative except as noted above.  PHYSICAL EXAMINATION: ECOG FS:2 - Symptomatic, <50% confined to bed  Vitals:   05/09/20 1403  BP: 125/85  Pulse: (!) 50  Resp: 17  Temp: 97.7 F (36.5 C)  SpO2: 100%   Wt Readings from Last 3 Encounters:  05/09/20 201 lb 1.6 oz (91.2 kg)  11/09/19 198 lb 1.6 oz (89.9 kg)  05/12/19 199 lb 9.6 oz (90.5 kg)   Body mass index is 28.85 kg/m.      GENERAL:alert, in no acute distress and comfortable SKIN: no acute rashes, no significant lesions EYES: conjunctiva are pink and non-injected, sclera anicteric OROPHARYNX: MMM, no exudates, no oropharyngeal erythema or ulceration NECK: supple, no JVD LYMPH:  no palpable lymphadenopathy in the cervical, axillary or inguinal regions LUNGS: clear to auscultation b/l with normal respiratory effort HEART: regular rate & rhythm ABDOMEN:  normoactive bowel sounds , non tender, not distended. Spleen just palpable, stable from last visit. Extremity: no pedal edema PSYCH: alert & oriented x 3 with fluent speech NEURO: no focal motor/sensory deficits  LABORATORY DATA:  I have reviewed the data as listed  . CBC Latest Ref Rng & Units 05/09/2020 11/09/2019 05/12/2019  WBC 4.0 - 10.5 K/uL 13.4(H)  11.1(H) 12.8(H)  Hemoglobin 13.0 - 17.0 g/dL 14.2 13.5 13.5  Hematocrit 39.0 - 52.0 % 42.7 39.7 40.5  Platelets 150 - 400 K/uL 152 145(L) 151   . CBC    Component Value Date/Time   WBC 13.4 (H) 05/09/2020 1344   RBC 4.59 05/09/2020 1344   HGB 14.2 05/09/2020 1344   HGB 13.5 11/09/2019 1018   HGB 13.9 04/07/2017 0939   HCT 42.7 05/09/2020 1344   HCT 41.8 04/07/2017 0939   PLT 152 05/09/2020 1344   PLT 145 (L) 11/09/2019 1018   PLT 149 04/07/2017 0939   MCV 93.0 05/09/2020 1344   MCV 91.7 04/07/2017 0939   MCH 30.9 05/09/2020 1344   MCHC 33.3 05/09/2020 1344   RDW 13.2 05/09/2020 1344   RDW 15.0 (H) 04/07/2017 0939   LYMPHSABS 8.1 (H) 05/09/2020 1344   LYMPHSABS 6.4 (H) 04/07/2017 0939   MONOABS 0.7 05/09/2020 1344   MONOABS 0.6 04/07/2017 0939   EOSABS 0.3 05/09/2020 1344   EOSABS 0.2 04/07/2017 0939   BASOSABS 0.1 05/09/2020 1344   BASOSABS 0.1 04/07/2017 0939     . CMP Latest Ref Rng & Units 05/09/2020 11/09/2019 05/12/2019  Glucose 70 - 99 mg/dL 83 100(H) 95  BUN 8 - 23 mg/dL _0 Creatinine 0.61 - 1.24 mg/dL 1.20 1.10 1.17  Sodium 135 - 145 mmol/L 141 138 141  Potassium 3.5 -  5.1 mmol/L 4.6 4.3 4.5  Chloride 98 - 111 mmol/L 107 107 107  CO2 22 - 32 mmol/L _1 Calcium 8.9 - 10.3 mg/dL 9.4 9.3 8.8(L)  Total Protein 6.5 - 8.1 g/dL 6.8 6.2(L) 6.1(L)  Total Bilirubin 0.3 - 1.2 mg/dL 0.6 0.6 0.6  Alkaline Phos 38 - 126 U/L 60 62 63  AST 15 - 41 U/L _2 ALT 0 - 44 U/L _3 . Lab Results  Component Value Date   LDH 171 05/09/2020    09/11/16 Molecular Pathology:   09/11/16 Peripheral blood flow cytometry:      RADIOGRAPHIC STUDIES: I have personally reviewed the radiological images as listed and agreed with the findings in the report. No results found.  ASSESSMENT & PLAN:   80 y.o. male with  1. 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  Mediastinal and right hilar lymphadenopathy has regressed. No new sites of disease are noted elsewhere in the chest, abdomen or pelvis.  10/04/17 CT Neck did not reveal any lymphadenopathy   Patient's spleen was described as normal in appearance on the 09/17/16 CT Abdomen/Pelvis. It has now grown to 13.5x7.7x16.3cm as of 10/04/17 CT A/P(estimated at 854m)  05/03/2018 CT C/A/P which revealed Single mildly enlarged right hilar lymph node and moderate splenomegaly may represent residual lymphoma. A right infrahilar lymph node which was previously enlarged is no longer enlarged. 2. Other imaging findings of potential clinical significance: Aortic Atherosclerosis. Coronary atherosclerosis. 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 Mild prostatomegaly. Lumbar impingement at L3-4 and L4-5. Right humeral lucency is likely an intraosseous ganglion as shown on prior MRI. Spleen volume wise appears smaller.  PLAN -Discussed pt labwork today, 05/09/2020; no major changes in blood counts. WBC are gradually increasing. Lymphocytes increased slightly. LDH normal, Plt normal,  chemistries look good. -Discussed alternating care with PCP every six months. The pt is agreeable to continuing visits here and  with PCP. -Continue 1000-2000 units of vitamin D. -No lab or clinical evidence of lymphoma progression at this time -Will see back in 6 months with labs.  FOLLOW UP: RTC with Dr Irene Limbo with labs in 6 months  The total time spent in the appointment was 20 minutes and more than 50% was on counseling and direct patient cares.  All of the patient's questions were answered with apparent satisfaction. The patient knows to call the clinic with any problems, questions or concerns.  Sullivan Lone MD [place] AAHIVMS [facility] Hematology/Oncology Physician [facility]  (Office):       [phone] (Work cell):  [phone] (Fax):           [phone]  05/09/2020 2:50 PM  I, Reinaldo Raddle, am acting as scribe for Dr. Sullivan Lone, MD.   .I have reviewed the above documentation for accuracy and completeness, and I agree with the above. Brunetta Genera MD

## 2020-05-09 ENCOUNTER — Inpatient Hospital Stay: Payer: PPO | Attending: Hematology

## 2020-05-09 ENCOUNTER — Telehealth: Payer: Self-pay | Admitting: Hematology

## 2020-05-09 ENCOUNTER — Inpatient Hospital Stay: Payer: PPO | Admitting: Hematology

## 2020-05-09 ENCOUNTER — Other Ambulatory Visit: Payer: Self-pay

## 2020-05-09 VITALS — BP 125/85 | HR 50 | Temp 97.7°F | Resp 17 | Ht 70.0 in | Wt 201.1 lb

## 2020-05-09 DIAGNOSIS — C8582 Other specified types of non-Hodgkin lymphoma, intrathoracic lymph nodes: Secondary | ICD-10-CM

## 2020-05-09 DIAGNOSIS — R161 Splenomegaly, not elsewhere classified: Secondary | ICD-10-CM

## 2020-05-09 DIAGNOSIS — F17210 Nicotine dependence, cigarettes, uncomplicated: Secondary | ICD-10-CM | POA: Diagnosis not present

## 2020-05-09 DIAGNOSIS — G4730 Sleep apnea, unspecified: Secondary | ICD-10-CM | POA: Insufficient documentation

## 2020-05-09 DIAGNOSIS — Z8572 Personal history of non-Hodgkin lymphomas: Secondary | ICD-10-CM | POA: Insufficient documentation

## 2020-05-09 DIAGNOSIS — I700 Atherosclerosis of aorta: Secondary | ICD-10-CM | POA: Diagnosis not present

## 2020-05-09 DIAGNOSIS — E7800 Pure hypercholesterolemia, unspecified: Secondary | ICD-10-CM | POA: Diagnosis not present

## 2020-05-09 DIAGNOSIS — Z86010 Personal history of colonic polyps: Secondary | ICD-10-CM | POA: Insufficient documentation

## 2020-05-09 LAB — CBC WITH DIFFERENTIAL/PLATELET
Abs Immature Granulocytes: 0.04 10*3/uL (ref 0.00–0.07)
Basophils Absolute: 0.1 10*3/uL (ref 0.0–0.1)
Basophils Relative: 0 %
Eosinophils Absolute: 0.3 10*3/uL (ref 0.0–0.5)
Eosinophils Relative: 2 %
HCT: 42.7 % (ref 39.0–52.0)
Hemoglobin: 14.2 g/dL (ref 13.0–17.0)
Immature Granulocytes: 0 %
Lymphocytes Relative: 61 %
Lymphs Abs: 8.1 10*3/uL — ABNORMAL HIGH (ref 0.7–4.0)
MCH: 30.9 pg (ref 26.0–34.0)
MCHC: 33.3 g/dL (ref 30.0–36.0)
MCV: 93 fL (ref 80.0–100.0)
Monocytes Absolute: 0.7 10*3/uL (ref 0.1–1.0)
Monocytes Relative: 5 %
Neutro Abs: 4.3 10*3/uL (ref 1.7–7.7)
Neutrophils Relative %: 32 %
Platelets: 152 10*3/uL (ref 150–400)
RBC: 4.59 MIL/uL (ref 4.22–5.81)
RDW: 13.2 % (ref 11.5–15.5)
WBC: 13.4 10*3/uL — ABNORMAL HIGH (ref 4.0–10.5)
nRBC: 0 % (ref 0.0–0.2)

## 2020-05-09 LAB — CMP (CANCER CENTER ONLY)
ALT: 20 U/L (ref 0–44)
AST: 24 U/L (ref 15–41)
Albumin: 4.5 g/dL (ref 3.5–5.0)
Alkaline Phosphatase: 60 U/L (ref 38–126)
Anion gap: 7 (ref 5–15)
BUN: 16 mg/dL (ref 8–23)
CO2: 27 mmol/L (ref 22–32)
Calcium: 9.4 mg/dL (ref 8.9–10.3)
Chloride: 107 mmol/L (ref 98–111)
Creatinine: 1.2 mg/dL (ref 0.61–1.24)
GFR, Estimated: >60 mL/min (ref >60)
Glucose, Bld: 83 mg/dL (ref 70–99)
Potassium: 4.6 mmol/L (ref 3.5–5.1)
Sodium: 141 mmol/L (ref 135–145)
Total Bilirubin: 0.6 mg/dL (ref 0.3–1.2)
Total Protein: 6.8 g/dL (ref 6.5–8.1)

## 2020-05-09 LAB — LACTATE DEHYDROGENASE: LDH: 171 U/L (ref 98–192)

## 2020-05-09 NOTE — Telephone Encounter (Signed)
Scheduled appointments per 2/3 los. Spoke to patient who is aware of appointments date and times.  

## 2020-07-01 DIAGNOSIS — H524 Presbyopia: Secondary | ICD-10-CM | POA: Diagnosis not present

## 2020-07-01 DIAGNOSIS — H5213 Myopia, bilateral: Secondary | ICD-10-CM | POA: Diagnosis not present

## 2020-07-01 DIAGNOSIS — H2513 Age-related nuclear cataract, bilateral: Secondary | ICD-10-CM | POA: Diagnosis not present

## 2020-07-01 DIAGNOSIS — H25012 Cortical age-related cataract, left eye: Secondary | ICD-10-CM | POA: Diagnosis not present

## 2020-07-29 IMAGING — CT CT CHEST W/ CM
3 of 6 series · 14 of 36 positions shown, 16 images · IV contrast (APPLIED)
Comparison: Multiple exams, including 10/04/2017

CLINICAL DATA: Marginal zone non-Hodgkin's lymphoma restaging

EXAM:
CT CHEST, ABDOMEN, AND PELVIS WITH CONTRAST
TECHNIQUE: Multidetector CT imaging of the chest, abdomen and pelvis was
performed following the standard protocol during bolus
administration of intravenous contrast.
CONTRAST:  100mL OMNIPAQUE IOHEXOL 300 MG/ML  SOLN

[Series 2: cap with · axial · 0.93mm/px · z∈[-387,+133]mm · 6 of 146 slices shown, 8 images (1 of 2)]
[im 21/146  mediastinal]
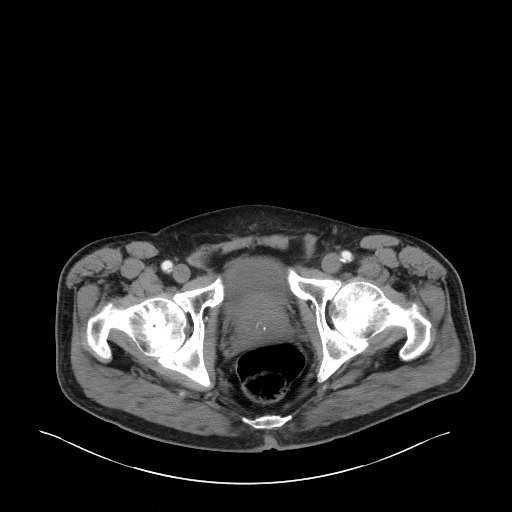
[im 21/146  lung]
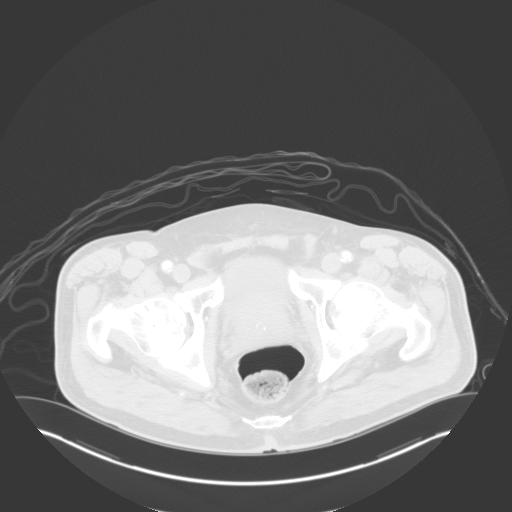
[im 42/146  lung]
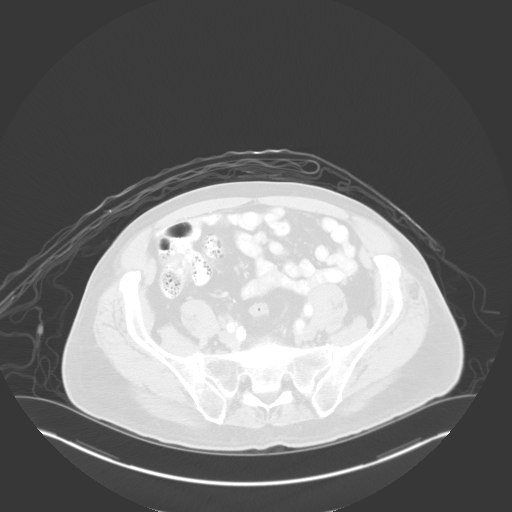
[im 63/146  lung]
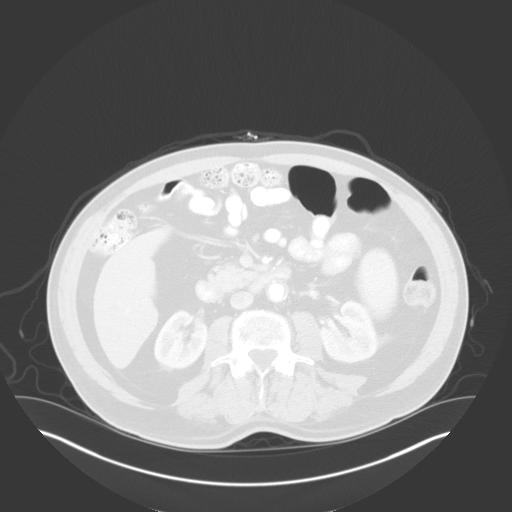
[im 83/146  lung]
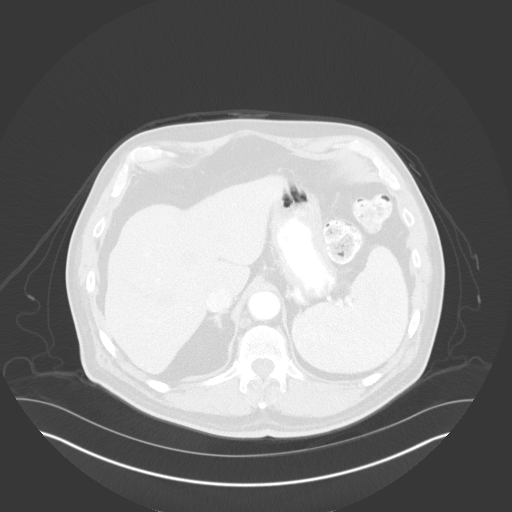
[im 104/146  mediastinal]
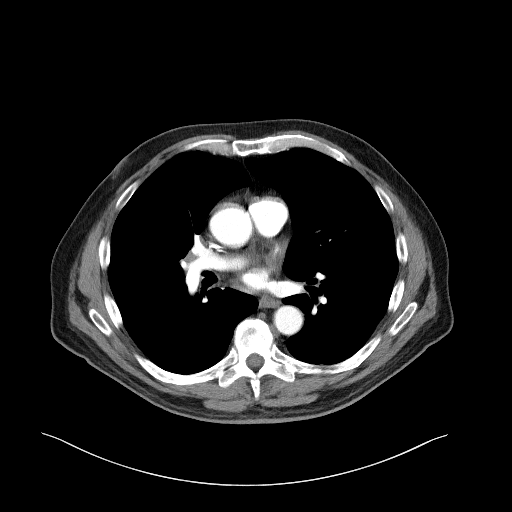
[im 104/146  lung]
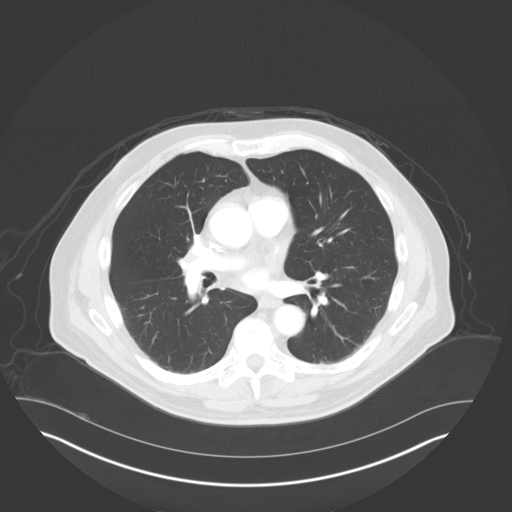
[im 125/146  lung]
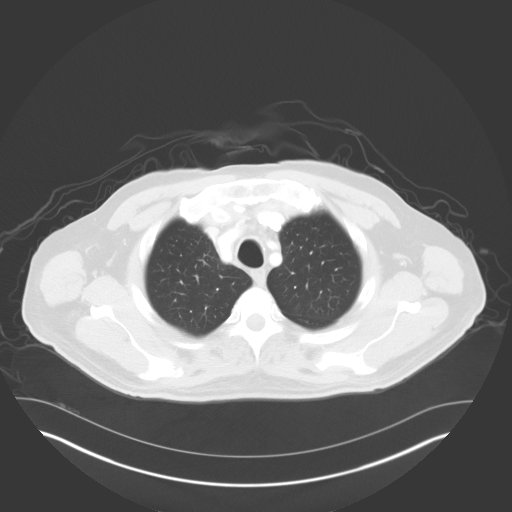

[Series 4: coronals · coronal · 0.81mm/px · 3 of 161 slices shown]
[im 33/161  lung]
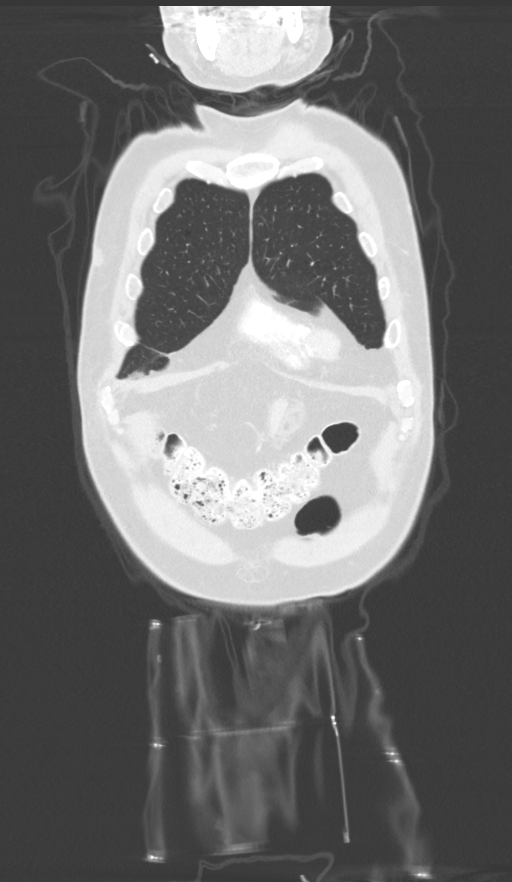
[im 65/161  lung]
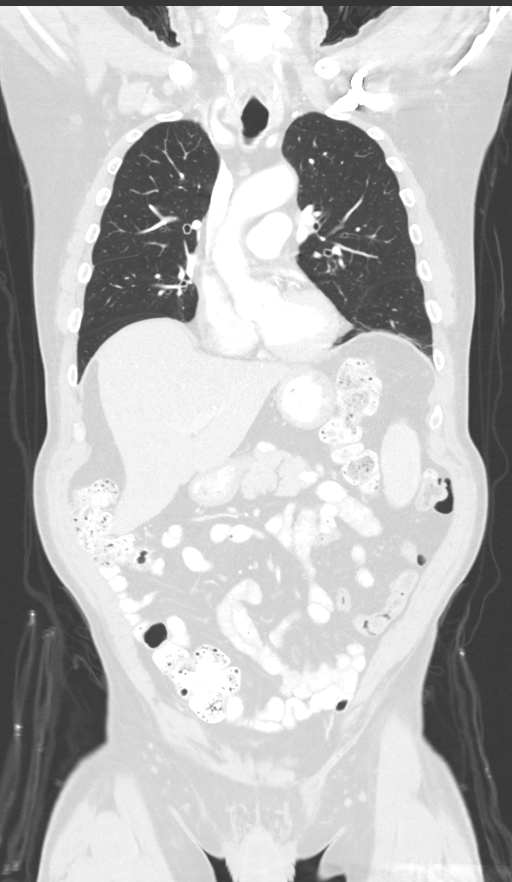
[im 97/161  lung]
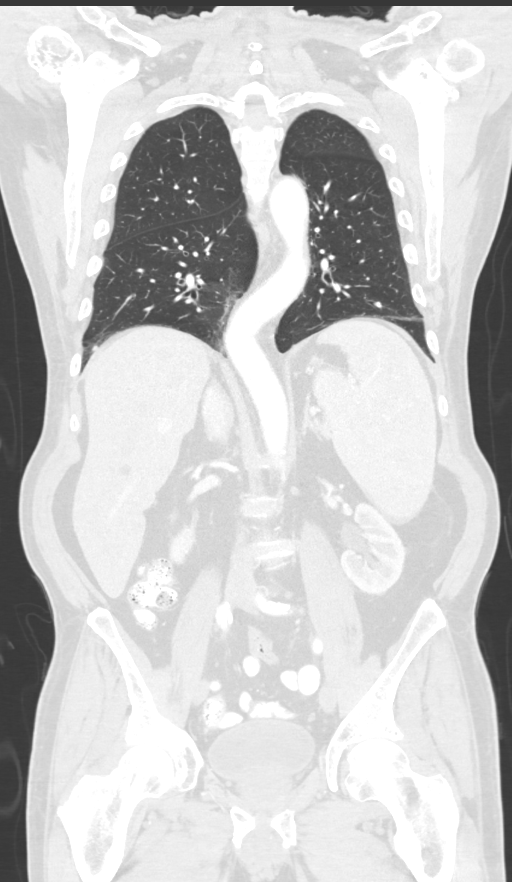

[Series 7: cap with · axial · 0.93mm/px · z∈[-389,+6]mm · 5 of 139 slices shown (2 of 2)]
[im 20/139  lung]
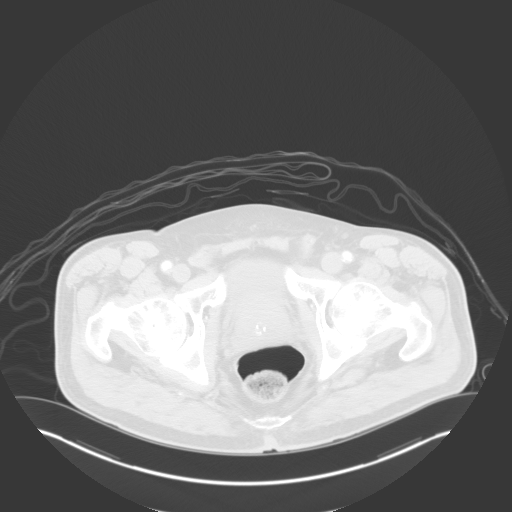
[im 40/139  lung]
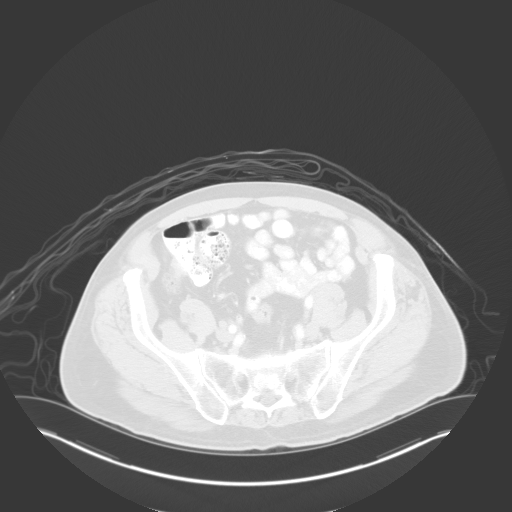
[im 60/139  lung]
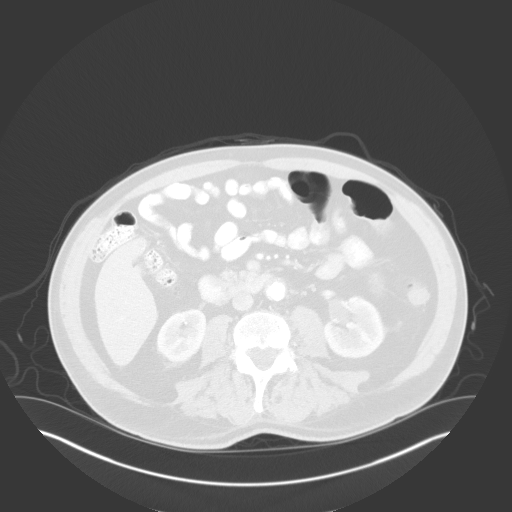
[im 79/139  lung]
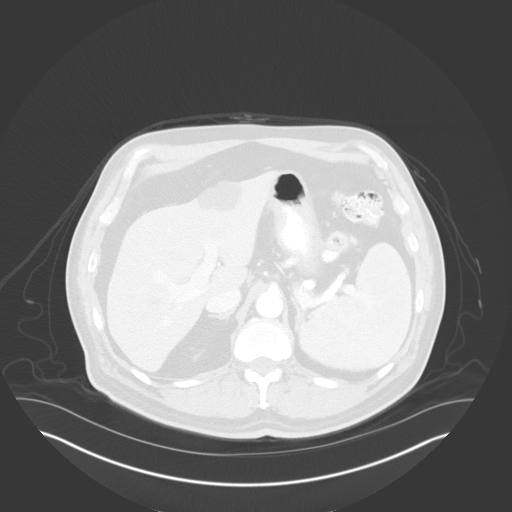
[im 99/139  lung]
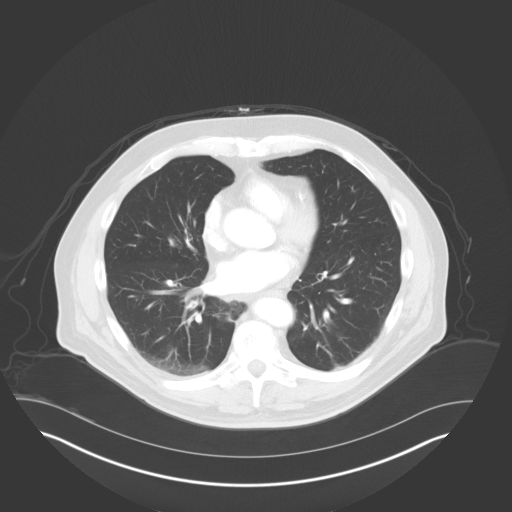

[14 of 36 positions shown; findings below may reference images not displayed]

FINDINGS: CT CHEST FINDINGS

Cardiovascular: Coronary, aortic arch, and branch vessel
atherosclerotic vascular disease.

Mediastinum/Nodes: Right hilar adenopathy 1.3 cm in short axis on
image 39/2, previously the same by my measurements. Additional small
mediastinal and hilar lymph nodes are not pathologically enlarged. A
prior right enlarged infrahilar node has resolved.

Lungs/Pleura: Stable 3 mm nodule, right upper lobe, image 49/6, no
change from 09/17/2016. Chronic scarring and ground-glass bands of
density in both lower lobes especially in the posterior basal
segments.

Musculoskeletal: Lucent lesion in the right humeral head. Lower
cervical spondylosis. Bone island in the T1 vertebral body. Lower
thoracic spondylosis and degenerative disc disease, T8 through T12.

CT ABDOMEN PELVIS FINDINGS

Hepatobiliary: Stable hepatic cysts. No new hepatic lesions.
Gallbladder unremarkable.

Pancreas: Unremarkable

Spleen: Spleen measures 12.5 by 6.5 by 16.6 cm (volume = 710
cm^3). Hypodense rim along the inferior splenic capsule as before.
There is only subtle internal heterogeneity in the spleen but
without a discrete mass identified.

Adrenals/Urinary Tract: Single small hypodense renal lesions are
likely cysts but technically too small to characterize. The adrenal
glands in urinary bladder appear unremarkable.

Stomach/Bowel: Unremarkable

Vascular/Lymphatic: Aortoiliac atherosclerotic vascular disease. A
right retrocrural lymph node measures 0.8 cm in short axis on image
64/2, stable. No pathologic adenopathy observed in the
abdomen/pelvis.

Reproductive: Mild prostatomegaly. Calcifications along the
posterior portion of the central zone of the prostate gland.

Other: Subtle mesenteric stranding in the upper pelvis on image
115/5, improved from prior, likely representing effective treatment.

Musculoskeletal: 0.8 cm degenerative anterolisthesis at L4-5 similar
to the prior exam. Spondylosis and degenerative disc disease
contribute to impingement at L3-4 and L4-5 bilaterally.
IMPRESSION: 1. Single mildly enlarged right hilar lymph node and moderate
splenomegaly may represent residual lymphoma. A right infrahilar
lymph node which was previously enlarged is no longer enlarged.
2. Other imaging findings of potential clinical significance: Aortic
Atherosclerosis ([id number]). Coronary atherosclerosis. Lower
thoracic spondylosis and degenerative disc disease. Mild
prostatomegaly. Lumbar impingement at L3-4 and L4-5. Right humeral
lucency is likely an intraosseous ganglion as shown on prior MRI.

## 2020-09-09 DIAGNOSIS — Z0000 Encounter for general adult medical examination without abnormal findings: Secondary | ICD-10-CM | POA: Diagnosis not present

## 2020-09-09 DIAGNOSIS — E782 Mixed hyperlipidemia: Secondary | ICD-10-CM | POA: Diagnosis not present

## 2020-09-09 DIAGNOSIS — G250 Essential tremor: Secondary | ICD-10-CM | POA: Diagnosis not present

## 2020-09-09 DIAGNOSIS — I700 Atherosclerosis of aorta: Secondary | ICD-10-CM | POA: Diagnosis not present

## 2020-09-09 DIAGNOSIS — C8580 Other specified types of non-Hodgkin lymphoma, unspecified site: Secondary | ICD-10-CM | POA: Diagnosis not present

## 2020-09-09 DIAGNOSIS — Z1389 Encounter for screening for other disorder: Secondary | ICD-10-CM | POA: Diagnosis not present

## 2020-09-09 DIAGNOSIS — D696 Thrombocytopenia, unspecified: Secondary | ICD-10-CM | POA: Diagnosis not present

## 2020-09-09 DIAGNOSIS — Z79899 Other long term (current) drug therapy: Secondary | ICD-10-CM | POA: Diagnosis not present

## 2020-10-09 DIAGNOSIS — E782 Mixed hyperlipidemia: Secondary | ICD-10-CM | POA: Diagnosis not present

## 2020-10-09 DIAGNOSIS — Z79899 Other long term (current) drug therapy: Secondary | ICD-10-CM | POA: Diagnosis not present

## 2020-10-11 DIAGNOSIS — L578 Other skin changes due to chronic exposure to nonionizing radiation: Secondary | ICD-10-CM | POA: Diagnosis not present

## 2020-10-11 DIAGNOSIS — D485 Neoplasm of uncertain behavior of skin: Secondary | ICD-10-CM | POA: Diagnosis not present

## 2020-10-11 DIAGNOSIS — L570 Actinic keratosis: Secondary | ICD-10-CM | POA: Diagnosis not present

## 2020-10-11 DIAGNOSIS — Z85831 Personal history of malignant neoplasm of soft tissue: Secondary | ICD-10-CM | POA: Diagnosis not present

## 2020-10-11 DIAGNOSIS — Z85828 Personal history of other malignant neoplasm of skin: Secondary | ICD-10-CM | POA: Diagnosis not present

## 2020-10-11 DIAGNOSIS — L814 Other melanin hyperpigmentation: Secondary | ICD-10-CM | POA: Diagnosis not present

## 2020-10-11 DIAGNOSIS — D225 Melanocytic nevi of trunk: Secondary | ICD-10-CM | POA: Diagnosis not present

## 2020-10-11 DIAGNOSIS — C44311 Basal cell carcinoma of skin of nose: Secondary | ICD-10-CM | POA: Diagnosis not present

## 2020-10-11 DIAGNOSIS — L821 Other seborrheic keratosis: Secondary | ICD-10-CM | POA: Diagnosis not present

## 2020-10-11 DIAGNOSIS — Z87898 Personal history of other specified conditions: Secondary | ICD-10-CM | POA: Diagnosis not present

## 2020-11-06 NOTE — Progress Notes (Signed)
HEMATOLOGY/ONCOLOGY CLINIC NOTE  Date of Service: 11/06/2020  Patient Care Team: [name], MD as PCP - General (Internal Medicine)  CHIEF COMPLAINTS/PURPOSE OF CONSULTATION:  B-Cell Lymphoma   HISTORY OF PRESENTING ILLNESS:   Kyle Sawyer is a wonderful 80 y.o. 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 He is accompanied today by his wife. The pt reports that he is doing well overall.   The pt had a blood flow cytometry on 09/11/16 which revealed a monoclonal B cell population, positive for CD20. A 09/11/16 FISH - CLL Panel was unremarkable save for 2.5% cells showing loss of 13q34 and 2% cells showing loss of p53. The pt has thus far been followed with repeat CT C/A/P every 3 months and last had a CT on 10/04/17 as noted below.   The pt reports that he has not developed any new concerns or symptoms since his last visit with Dr Lebron Conners in April.   The pt denies having ever received a blood transfusion nor has any tattoos.   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   He notes that he had the old shingles vaccine but had a shingles outbreak in December 2018.   Of note prior to the patient's visit today, pt has had CT C/A/P completed on 10/04/17 with results revealing Persistent splenomegaly. 2. Mediastinal and right hilar lymphadenopathy has regressed. 3. No new sites of disease are noted elsewhere in the chest, abdomen or pelvis. 4. Subtle inflammatory changes in the fat of the sigmoid mesocolon. This is of uncertain etiology and significance. Typically this is seen in the setting of diverticular disease, however, no adjacent colonic diverticulae are noted. Attention on routine imaging follow-up is recommended to ensure resolution. 5. Aortic atherosclerosis, in addition to left main and 3 vessel coronary artery disease. Assessment for potential risk factor modification, dietary  therapy or pharmacologic therapy may be warranted, if clinically indicated. 6. There are calcifications of the aortic valve. Echocardiographic correlation for evaluation of potential valvular dysfunction may be warranted if clinically indicated. 7. Additional incidental findings.   The pt also had a CT Soft Tissue Neck on 10/04/17 which revealed No evidence of cervical lymphadenopathy.   Most recent lab results (10/04/17) of CBC w/diff, CMP  is as follows: all values are WNL except for WBC at 11.7k, Lymphs abs at 6.4. LDH 10/04/17 is WNL at 187  On review of systems, pt reports stable energy levels, and denies fevers, chills, night sweats, unexpected weight loss, abdominal pains, chest pain, shortness of breath, leg swelling, and any other symptoms.   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 The patient's last visit with [place] was on 05/09/2020. The pt reports that he is doing well overall.  The pt reports no acute new symptoms.  No fevers no chills no night sweats.  No unexpected new weight loss or significant new fatigue. No abdominal pain or distention.  Lab results today 11/07/2020 of CBC w/diff shows chronic leukocytosis with lymphocytosis that is stable at 7.9k, minimal thrombocytopenia at 144k no anemia with a hemoglobin of 14.2. 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    SURGICAL HISTORY: Past Surgical History:   Procedure Laterality Date   HEMORRHOID SURGERY     HERNIA REPAIR     LIH 2007   SKIN SURGERY     various unspecified    SOCIAL HISTORY: Social History   Socioeconomic History   Marital status: Married    Spouse name: Not on file   Number of children: Not on file   Years of education: Not on file   Highest education level: Not on file  Occupational History   Occupation: Retired    Fish farm manager: [employer]  Tobacco Use   Smoking status: Every Day    Types: Pipe   Smokeless tobacco: Never  Vaping Use   Vaping Use: Never used  Substance and Sexual Activity   Alcohol use: No   Drug use: No   Sexual activity: Not on file  Other Topics Concern   Not on file  Social History Narrative   Not on file   Social Determinants of Health   Financial Resource Strain: Not on file  Food Insecurity: Not on file  Transportation Needs: Not on file  Physical Activity: Not on file  Stress: Not on file  Social Connections: Not on file  Intimate Partner Violence: Not on file    FAMILY HISTORY: No family history on file.  ALLERGIES:  has No Known Allergies.  MEDICATIONS:  Current Outpatient Medications  Medication Sig Dispense Refill   atorvastatin (LIPITOR) 10 MG tablet Take 10 mg by mouth daily.     Cetirizine HCl (ZYRTEC PO) Take by mouth.     Coenzyme Q10 (COQ10 PO) Take 1 tablet by mouth daily.     FENOFIBRATE PO Take 45 mg by mouth daily.     No current facility-administered medications for this visit.    REVIEW OF SYSTEMS:   10 Point review of Systems was done is negative except as noted above.  PHYSICAL EXAMINATION: ECOG FS:2 - Symptomatic, <50% confined to bed  Vitals:   11/07/20 0940  BP: 111/60  Pulse: 65  Resp: 17  Temp: 97.8 F (36.6 C)  SpO2: 99%    Wt Readings from Last 3 Encounters:  05/09/20 201 lb 1.6 oz (91.2 kg)  11/09/19 198 lb 1.6 oz (89.9 kg)  05/12/19 199 lb 9.6 oz (90.5 kg)   Body mass index is 28.19 kg/m.     NAD GENERAL:alert, in no acute distress and comfortable SKIN: no acute rashes, no significant lesions EYES: conjunctiva are pink and non-injected, sclera anicteric OROPHARYNX: MMM, no exudates, no oropharyngeal erythema or ulceration NECK: supple, no JVD LYMPH:  no palpable lymphadenopathy in the cervical, axillary or inguinal regions LUNGS: clear to auscultation b/l with normal respiratory effort HEART: regular rate & rhythm ABDOMEN:  normoactive bowel sounds , non tender, not distended. Spleen just palpable, stable from last visit. Extremity: no pedal edema PSYCH: alert & oriented x 3 with fluent speech NEURO: no focal motor/sensory deficits  LABORATORY DATA:  I have reviewed the data as listed  . CBC Latest Ref Rng & Units 05/09/2020 11/09/2019 05/12/2019  WBC 4.0 - 10.5 K/uL 13.4(H) 11.1(H) 12.8(H)  Hemoglobin 13.0 - 17.0 g/dL 14.2 13.5 13.5  Hematocrit 39.0 - 52.0 % 42.7 39.7 40.5  Platelets 150 - 400 K/uL 152 145(L) 151   . CBC    Component Value Date/Time   WBC 13.4 (H) 05/09/2020 1344   RBC 4.59 05/09/2020 1344   HGB 14.2 05/09/2020 1344   HGB 13.5 11/09/2019 1018   HGB 13.9 04/07/2017 0939   HCT 42.7 05/09/2020 1344   HCT 41.8 04/07/2017 0939   PLT 152 05/09/2020 1344   PLT 145 (L) 11/09/2019 1018   PLT 149 04/07/2017 0939   MCV 93.0 05/09/2020 1344   MCV 91.7 04/07/2017 0939   MCH 30.9 05/09/2020 1344   MCHC 33.3 05/09/2020 1344   RDW 13.2 05/09/2020 1344   RDW 15.0 (H) 04/07/2017 0939   LYMPHSABS 8.1 (H) 05/09/2020 1344   LYMPHSABS 6.4 (H) 04/07/2017 0939   MONOABS 0.7 05/09/2020 1344   MONOABS 0.6 04/07/2017 0939   EOSABS 0.3 05/09/2020 1344   EOSABS 0.2 04/07/2017 0939   BASOSABS 0.1 05/09/2020 1344   BASOSABS 0.1 04/07/2017 0939     . CMP Latest Ref Rng & Units 05/09/2020 11/09/2019 05/12/2019  Glucose 70 - 99 mg/dL 83 100(H) 95  BUN 8 - 23 mg/dL 16 12 16   Creatinine 0.61 - 1.24 mg/dL 1.20 1.10 1.17  Sodium 135 - 145 mmol/L 141 138 141  Potassium 3.5  - 5.1 mmol/L 4.6 4.3 4.5  Chloride 98 - 111 mmol/L 107 107 107  CO2 22 - 32 mmol/L 27 24 25   Calcium 8.9 - 10.3 mg/dL 9.4 9.3 8.8(L)  Total Protein 6.5 - 8.1 g/dL 6.8 6.2(L) 6.1(L)  Total Bilirubin 0.3 - 1.2 mg/dL 0.6 0.6 0.6  Alkaline Phos 38 - 126 U/L 60 62 63  AST 15 - 41 U/L 24 21 19   ALT 0 - 44 U/L 20 14 15    . Lab Results  Component Value Date   LDH 171 05/09/2020    09/11/16 Molecular Pathology:   09/11/16 Peripheral blood flow cytometry:      RADIOGRAPHIC STUDIES: I have personally reviewed the radiological images as listed and agreed with the findings in the report. No results found.  ASSESSMENT & PLAN:   80 y.o. male with  1. 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  Mediastinal and right hilar lymphadenopathy has regressed. No new sites of disease are noted elsewhere in the chest, abdomen or pelvis.  10/04/17 CT Neck did not reveal any lymphadenopathy   Patient's spleen was described as normal in appearance on the 09/17/16 CT Abdomen/Pelvis. It has now grown to 13.5x7.7x16.3cm as of 10/04/17 CT A/P(estimated at 857m)  05/03/2018 CT C/A/P which revealed Single mildly enlarged right hilar lymph node and moderate splenomegaly may represent residual lymphoma. A right infrahilar lymph node which was previously enlarged is no longer enlarged. 2. Other imaging findings of potential clinical significance: Aortic Atherosclerosis. Coronary atherosclerosis. 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 Mild prostatomegaly. Lumbar impingement at L3-4 and L4-5. Right humeral lucency is likely an intraosseous ganglion as shown on prior MRI. Spleen volume wise appears smaller.  PLAN -Discussed pt labwork today, 11/07/2020; Lab results today 11/07/2020 of CBC w/diff shows chronic leukocytosis with lymphocytosis that is stable at 7.9k, minimal  thrombocytopenia at 144k no anemia with a hemoglobin of 14.2. CMP is within normal limits LDH is 182 -Continue 1000-2000 units of  vitamin D. -No lab or clinical evidence of significant lymphoma progression at this time warranting treatment. -Will see back in 6 months   FOLLOW UP: RTC with Dr Irene Limbo with labs in 6 months   The total time spent in the appointment was 20 minutes and more than 50% was on counseling and direct patient cares.  All of the patient's questions were answered with apparent satisfaction. The patient knows to call the clinic with any problems, questions or concerns.  Sullivan Lone MD [place] AAHIVMS [facility] Hematology/Oncology Physician [facility]  (Office):       [phone] (Work cell):  [phone] (Fax):           [phone]  11/06/2020 6:56 PM  I, Reinaldo Raddle, am acting as scribe for Dr. Sullivan Lone, MD.  .I have reviewed the above documentation for accuracy and completeness, and I agree with the above. Brunetta Genera MD

## 2020-11-07 ENCOUNTER — Inpatient Hospital Stay: Payer: PPO | Attending: Hematology

## 2020-11-07 ENCOUNTER — Inpatient Hospital Stay: Payer: PPO | Admitting: Hematology

## 2020-11-07 ENCOUNTER — Other Ambulatory Visit: Payer: Self-pay

## 2020-11-07 VITALS — BP 111/60 | HR 65 | Temp 97.8°F | Resp 17 | Ht 70.0 in | Wt 196.5 lb

## 2020-11-07 DIAGNOSIS — C8582 Other specified types of non-Hodgkin lymphoma, intrathoracic lymph nodes: Secondary | ICD-10-CM

## 2020-11-07 DIAGNOSIS — G4730 Sleep apnea, unspecified: Secondary | ICD-10-CM | POA: Diagnosis not present

## 2020-11-07 DIAGNOSIS — Z86010 Personal history of colonic polyps: Secondary | ICD-10-CM | POA: Diagnosis not present

## 2020-11-07 DIAGNOSIS — N400 Enlarged prostate without lower urinary tract symptoms: Secondary | ICD-10-CM | POA: Insufficient documentation

## 2020-11-07 DIAGNOSIS — I700 Atherosclerosis of aorta: Secondary | ICD-10-CM | POA: Insufficient documentation

## 2020-11-07 DIAGNOSIS — C8510 Unspecified B-cell lymphoma, unspecified site: Secondary | ICD-10-CM | POA: Diagnosis not present

## 2020-11-07 DIAGNOSIS — Z85828 Personal history of other malignant neoplasm of skin: Secondary | ICD-10-CM | POA: Diagnosis not present

## 2020-11-07 DIAGNOSIS — E7800 Pure hypercholesterolemia, unspecified: Secondary | ICD-10-CM | POA: Diagnosis not present

## 2020-11-07 DIAGNOSIS — I2510 Atherosclerotic heart disease of native coronary artery without angina pectoris: Secondary | ICD-10-CM | POA: Diagnosis not present

## 2020-11-07 LAB — CBC WITH DIFFERENTIAL/PLATELET
Abs Immature Granulocytes: 0.05 10*3/uL (ref 0.00–0.07)
Basophils Absolute: 0.1 10*3/uL (ref 0.0–0.1)
Basophils Relative: 0 %
Eosinophils Absolute: 0.3 10*3/uL (ref 0.0–0.5)
Eosinophils Relative: 2 %
HCT: 38.8 % — ABNORMAL LOW (ref 39.0–52.0)
Hemoglobin: 13.2 g/dL (ref 13.0–17.0)
Immature Granulocytes: 0 %
Lymphocytes Relative: 57 %
Lymphs Abs: 7.9 10*3/uL — ABNORMAL HIGH (ref 0.7–4.0)
MCH: 31 pg (ref 26.0–34.0)
MCHC: 34 g/dL (ref 30.0–36.0)
MCV: 91.1 fL (ref 80.0–100.0)
Monocytes Absolute: 1 10*3/uL (ref 0.1–1.0)
Monocytes Relative: 7 %
Neutro Abs: 4.8 10*3/uL (ref 1.7–7.7)
Neutrophils Relative %: 34 %
Platelets: 144 10*3/uL — ABNORMAL LOW (ref 150–400)
RBC: 4.26 MIL/uL (ref 4.22–5.81)
RDW: 13.8 % (ref 11.5–15.5)
Smear Review: NORMAL
WBC Morphology: REACTIVE
WBC: 14.2 10*3/uL — ABNORMAL HIGH (ref 4.0–10.5)
nRBC: 0 % (ref 0.0–0.2)

## 2020-11-07 LAB — CMP (CANCER CENTER ONLY)
ALT: 17 U/L (ref 0–44)
AST: 24 U/L (ref 15–41)
Albumin: 3.9 g/dL (ref 3.5–5.0)
Alkaline Phosphatase: 54 U/L (ref 38–126)
Anion gap: 7 (ref 5–15)
BUN: 14 mg/dL (ref 8–23)
CO2: 25 mmol/L (ref 22–32)
Calcium: 9.1 mg/dL (ref 8.9–10.3)
Chloride: 109 mmol/L (ref 98–111)
Creatinine: 1 mg/dL (ref 0.61–1.24)
GFR, Estimated: >60 mL/min (ref >60)
Glucose, Bld: 89 mg/dL (ref 70–99)
Potassium: 4.5 mmol/L (ref 3.5–5.1)
Sodium: 141 mmol/L (ref 135–145)
Total Bilirubin: 0.7 mg/dL (ref 0.3–1.2)
Total Protein: 6 g/dL — ABNORMAL LOW (ref 6.5–8.1)

## 2020-11-07 LAB — LACTATE DEHYDROGENASE: LDH: 182 U/L (ref 98–192)

## 2020-12-03 DIAGNOSIS — C44311 Basal cell carcinoma of skin of nose: Secondary | ICD-10-CM | POA: Diagnosis not present

## 2021-02-19 DIAGNOSIS — Z03818 Encounter for observation for suspected exposure to other biological agents ruled out: Secondary | ICD-10-CM | POA: Diagnosis not present

## 2021-02-19 DIAGNOSIS — R051 Acute cough: Secondary | ICD-10-CM | POA: Diagnosis not present

## 2021-04-16 DIAGNOSIS — Z87898 Personal history of other specified conditions: Secondary | ICD-10-CM | POA: Diagnosis not present

## 2021-04-16 DIAGNOSIS — L821 Other seborrheic keratosis: Secondary | ICD-10-CM | POA: Diagnosis not present

## 2021-04-16 DIAGNOSIS — Z85828 Personal history of other malignant neoplasm of skin: Secondary | ICD-10-CM | POA: Diagnosis not present

## 2021-04-16 DIAGNOSIS — D225 Melanocytic nevi of trunk: Secondary | ICD-10-CM | POA: Diagnosis not present

## 2021-04-16 DIAGNOSIS — L570 Actinic keratosis: Secondary | ICD-10-CM | POA: Diagnosis not present

## 2021-04-16 DIAGNOSIS — Z85831 Personal history of malignant neoplasm of soft tissue: Secondary | ICD-10-CM | POA: Diagnosis not present

## 2021-04-16 DIAGNOSIS — L814 Other melanin hyperpigmentation: Secondary | ICD-10-CM | POA: Diagnosis not present

## 2021-04-16 DIAGNOSIS — L578 Other skin changes due to chronic exposure to nonionizing radiation: Secondary | ICD-10-CM | POA: Diagnosis not present

## 2021-04-16 DIAGNOSIS — Z23 Encounter for immunization: Secondary | ICD-10-CM | POA: Diagnosis not present

## 2021-05-09 ENCOUNTER — Other Ambulatory Visit: Payer: Self-pay

## 2021-05-09 DIAGNOSIS — C8582 Other specified types of non-Hodgkin lymphoma, intrathoracic lymph nodes: Secondary | ICD-10-CM

## 2021-05-12 ENCOUNTER — Other Ambulatory Visit: Payer: Self-pay

## 2021-05-12 ENCOUNTER — Inpatient Hospital Stay: Payer: PPO

## 2021-05-12 ENCOUNTER — Inpatient Hospital Stay: Payer: PPO | Attending: Hematology | Admitting: Hematology

## 2021-05-12 VITALS — BP 106/61 | HR 70 | Temp 97.9°F | Resp 20 | Wt 201.9 lb

## 2021-05-12 DIAGNOSIS — D696 Thrombocytopenia, unspecified: Secondary | ICD-10-CM | POA: Insufficient documentation

## 2021-05-12 DIAGNOSIS — I2510 Atherosclerotic heart disease of native coronary artery without angina pectoris: Secondary | ICD-10-CM | POA: Insufficient documentation

## 2021-05-12 DIAGNOSIS — C8582 Other specified types of non-Hodgkin lymphoma, intrathoracic lymph nodes: Secondary | ICD-10-CM

## 2021-05-12 DIAGNOSIS — E7800 Pure hypercholesterolemia, unspecified: Secondary | ICD-10-CM | POA: Diagnosis not present

## 2021-05-12 DIAGNOSIS — I700 Atherosclerosis of aorta: Secondary | ICD-10-CM | POA: Insufficient documentation

## 2021-05-12 DIAGNOSIS — C8510 Unspecified B-cell lymphoma, unspecified site: Secondary | ICD-10-CM | POA: Diagnosis not present

## 2021-05-12 DIAGNOSIS — G4730 Sleep apnea, unspecified: Secondary | ICD-10-CM | POA: Insufficient documentation

## 2021-05-12 LAB — CMP (CANCER CENTER ONLY)
ALT: 13 U/L (ref 0–44)
AST: 19 U/L (ref 15–41)
Albumin: 4.3 g/dL (ref 3.5–5.0)
Alkaline Phosphatase: 51 U/L (ref 38–126)
Anion gap: 7 (ref 5–15)
BUN: 19 mg/dL (ref 8–23)
CO2: 25 mmol/L (ref 22–32)
Calcium: 8.9 mg/dL (ref 8.9–10.3)
Chloride: 109 mmol/L (ref 98–111)
Creatinine: 1.19 mg/dL (ref 0.61–1.24)
GFR, Estimated: >60 mL/min (ref >60)
Glucose, Bld: 84 mg/dL (ref 70–99)
Potassium: 4.3 mmol/L (ref 3.5–5.1)
Sodium: 141 mmol/L (ref 135–145)
Total Bilirubin: 0.8 mg/dL (ref 0.3–1.2)
Total Protein: 6.2 g/dL — ABNORMAL LOW (ref 6.5–8.1)

## 2021-05-12 LAB — CBC WITH DIFFERENTIAL (CANCER CENTER ONLY)
Abs Immature Granulocytes: 0.03 10*3/uL (ref 0.00–0.07)
Basophils Absolute: 0.1 10*3/uL (ref 0.0–0.1)
Basophils Relative: 1 %
Eosinophils Absolute: 0.4 10*3/uL (ref 0.0–0.5)
Eosinophils Relative: 3 %
HCT: 38.8 % — ABNORMAL LOW (ref 39.0–52.0)
Hemoglobin: 13.1 g/dL (ref 13.0–17.0)
Immature Granulocytes: 0 %
Lymphocytes Relative: 62 %
Lymphs Abs: 7.9 10*3/uL — ABNORMAL HIGH (ref 0.7–4.0)
MCH: 29.9 pg (ref 26.0–34.0)
MCHC: 33.8 g/dL (ref 30.0–36.0)
MCV: 88.6 fL (ref 80.0–100.0)
Monocytes Absolute: 0.7 10*3/uL (ref 0.1–1.0)
Monocytes Relative: 5 %
Neutro Abs: 3.7 10*3/uL (ref 1.7–7.7)
Neutrophils Relative %: 29 %
Platelet Count: 127 10*3/uL — ABNORMAL LOW (ref 150–400)
RBC: 4.38 MIL/uL (ref 4.22–5.81)
RDW: 14.1 % (ref 11.5–15.5)
Smear Review: NORMAL
WBC Count: 12.8 10*3/uL — ABNORMAL HIGH (ref 4.0–10.5)
nRBC: 0 % (ref 0.0–0.2)

## 2021-05-12 LAB — LACTATE DEHYDROGENASE: LDH: 147 U/L (ref 98–192)

## 2021-05-12 NOTE — Progress Notes (Signed)
HEMATOLOGY/ONCOLOGY CLINIC NOTE  Date of Service: 05/12/2021  Patient Care Team: [name], MD as PCP - General (Internal Medicine)  CHIEF COMPLAINTS/PURPOSE OF CONSULTATION:  Follow-up for continued evaluation and management of splenic marginal zone lymphoma  HISTORY OF PRESENTING ILLNESS:   Kyle Sawyer is a wonderful 81 y.o. 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 He is accompanied today by his wife. The pt reports that he is doing well overall.   The pt had a blood flow cytometry on 09/11/16 which revealed a monoclonal B cell population, positive for CD20. A 09/11/16 FISH - CLL Panel was unremarkable save for 2.5% cells showing loss of 13q34 and 2% cells showing loss of p53. The pt has thus far been followed with repeat CT C/A/P every 3 months and last had a CT on 10/04/17 as noted below.   The pt reports that he has not developed any new concerns or symptoms since his last visit with Dr Lebron Conners in April.   The pt denies having ever received a blood transfusion nor has any tattoos.   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   He notes that he had the old shingles vaccine but had a shingles outbreak in December 2018.   Of note prior to the patient's visit today, pt has had CT C/A/P completed on 10/04/17 with results revealing Persistent splenomegaly. 2. Mediastinal and right hilar lymphadenopathy has regressed. 3. No new sites of disease are noted elsewhere in the chest, abdomen or pelvis. 4. Subtle inflammatory changes in the fat of the sigmoid mesocolon. This is of uncertain etiology and significance. Typically this is seen in the setting of diverticular disease, however, no adjacent colonic diverticulae are noted. Attention on routine imaging follow-up is recommended to ensure resolution. 5. Aortic atherosclerosis, in addition to left main and 3 vessel coronary artery  disease. Assessment for potential risk factor modification, dietary therapy or pharmacologic therapy may be warranted, if clinically indicated. 6. There are calcifications of the aortic valve. Echocardiographic correlation for evaluation of potential valvular dysfunction may be warranted if clinically indicated. 7. Additional incidental findings.   The pt also had a CT Soft Tissue Neck on 10/04/17 which revealed No evidence of cervical lymphadenopathy.   Most recent lab results (10/04/17) of CBC w/diff, CMP  is as follows: all values are WNL except for WBC at 11.7k, Lymphs abs at 6.4. LDH 10/04/17 is WNL at 187  On review of systems, pt reports stable energy levels, and denies fevers, chills, night sweats, unexpected weight loss, abdominal pains, chest pain, shortness of breath, leg swelling, and any other symptoms.   Interval History:   Kyle Sawyer is here for continued evaluation and management of his splenic marginal zone lymphoma.  He was last seen by clinic about 6 months ago. He notes no acute new symptoms since his last clinic visit.  No fevers no chills no night sweats no unexpected weight loss. Notes that he has gained some weight over the holidays estimates about 7 pounds. Mild myalgias from Lipitor and will switch to Crestor by his primary care physician.  He is also taking co-Q10. No new lumps or bumps. Note he recently had a dermatology evaluation to rule out any other new skin lesions given his history of nonmelanoma skin cancers.  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     SURGICAL HISTORY: Past Surgical History:  Procedure Laterality Date   HEMORRHOID SURGERY     HERNIA REPAIR     LIH 2007   SKIN SURGERY     various unspecified    SOCIAL HISTORY: Social History   Socioeconomic History   Marital status: Married    Spouse name: Not on file   Number of children: Not on file   Years of education: Not on file   Highest education level: Not on file  Occupational History   Occupation: Retired    Fish farm manager: [employer]  Tobacco Use   Smoking status: Every Day    Types: Pipe   Smokeless tobacco: Never  Vaping Use   Vaping Use: Never used  Substance and Sexual Activity   Alcohol use: No   Drug use: No   Sexual activity: Not on file  Other Topics Concern   Not on file  Social History Narrative   Not on file   Social Determinants of Health   Financial Resource Strain: Not on file  Food Insecurity: Not on file  Transportation Needs: Not on file  Physical Activity: Not on file  Stress: Not on file  Social Connections: Not on file  Intimate Partner Violence: Not on file    FAMILY HISTORY: No family history on file.  ALLERGIES:  has No Known Allergies.  MEDICATIONS:  Current Outpatient Medications  Medication Sig Dispense Refill   atorvastatin (LIPITOR) 10 MG tablet Take 10 mg by mouth daily. (Patient not taking: No sig reported)     Cetirizine HCl (ZYRTEC PO) Take by mouth.     Coenzyme Q10 (COQ10 PO) Take 1 tablet by mouth daily.     FENOFIBRATE PO Take 45 mg by mouth daily.     rosuvastatin (CRESTOR) 5 MG tablet Take 5 mg by mouth daily.     No current facility-administered medications for this visit.    REVIEW OF SYSTEMS:   .10 Point review of Systems was done is negative except as noted above.  PHYSICAL EXAMINATION: ECOG FS:2 - Symptomatic, <50% confined to bed  Vitals:   05/12/21 0925  BP: 106/61  Pulse: 70  Resp: 20  Temp: 97.9 F (36.6 C)  SpO2: 98%    Wt Readings from Last 3 Encounters:   05/12/21 201 lb 14.4 oz (91.6 kg)  11/07/20 196 lb 8 oz (89.1 kg)  05/09/20 201 lb 1.6 oz (91.2 kg)   Body mass index is 28.97 kg/m.   NAD GENERAL:alert, in no acute distress and comfortable SKIN: no acute rashes, no significant lesions EYES: conjunctiva are pink and non-injected, sclera anicteric OROPHARYNX: MMM, no exudates, no oropharyngeal erythema or ulceration NECK: supple, no JVD LYMPH:  no palpable lymphadenopathy in the cervical, axillary or inguinal regions LUNGS: clear to auscultation b/l with normal respiratory effort HEART: regular rate & rhythm ABDOMEN:  normoactive bowel sounds , non tender, not distended.  Borderline palpable splenomegaly 1 fingerbreadth below costal margin. Extremity: no pedal edema PSYCH: alert & oriented x 3 with fluent speech NEURO: no focal motor/sensory deficits  LABORATORY DATA:  I have reviewed the data as listed  . CBC Latest Ref Rng & Units 05/12/2021 11/07/2020 05/09/2020  WBC 4.0 - 10.5 K/uL 12.8(H) 14.2(H) 13.4(H)  Hemoglobin 13.0 - 17.0 g/dL 13.1 13.2 14.2  Hematocrit 39.0 - 52.0 % 38.8(L) 38.8(L) 42.7  Platelets 150 - 400 K/uL 127(L) 144(L) 152   . CBC    Component Value Date/Time   WBC 12.8 (H) 05/12/2021 0855   WBC 14.2 (H) 11/07/2020 0905   RBC 4.38 05/12/2021 0855   HGB 13.1 05/12/2021 0855   HGB 13.9 04/07/2017 0939   HCT 38.8 (L) 05/12/2021 0855   HCT 41.8 04/07/2017 0939   PLT 127 (L) 05/12/2021 0855   PLT 149 04/07/2017 0939   MCV 88.6 05/12/2021 0855   MCV 91.7 04/07/2017 0939   MCH 29.9 05/12/2021 0855   MCHC 33.8 05/12/2021 0855   RDW 14.1 05/12/2021 0855   RDW 15.0 (H) 04/07/2017 0939   LYMPHSABS PENDING 05/12/2021 0855   LYMPHSABS 6.4 (H) 04/07/2017 0939   MONOABS PENDING 05/12/2021 0855   MONOABS 0.6 04/07/2017 0939   EOSABS PENDING 05/12/2021 0855   EOSABS 0.2 04/07/2017 0939   BASOSABS PENDING 05/12/2021 0855   BASOSABS 0.1 04/07/2017 0939     . CMP Latest Ref Rng & Units 05/12/2021 11/07/2020  05/09/2020  Glucose 70 - 99 mg/dL 84 89 83  BUN 8 - 23 mg/dL 19 14 16   Creatinine 0.61 - 1.24 mg/dL 1.19 1.00 1.20  Sodium 135 - 145 mmol/L 141 141 141  Potassium 3.5 - 5.1 mmol/L 4.3 4.5 4.6  Chloride 98 - 111 mmol/L 109 109 107  CO2 22 - 32 mmol/L 25 25 27   Calcium 8.9 - 10.3 mg/dL 8.9 9.1 9.4  Total Protein 6.5 - 8.1 g/dL 6.2(L) 6.0(L) 6.8  Total Bilirubin 0.3 - 1.2 mg/dL 0.8 0.7 0.6  Alkaline Phos 38 - 126 U/L 51 54 60  AST 15 - 41 U/L 19 24 24   ALT 0 - 44 U/L 13 17 20    . Lab Results  Component Value Date   LDH 147 05/12/2021    09/11/16 Molecular Pathology:   09/11/16 Peripheral blood flow cytometry:      RADIOGRAPHIC STUDIES: I have personally reviewed the radiological images as listed and agreed with the findings in the report. No results found.  ASSESSMENT & PLAN:   81 y.o. male with  1. 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  Mediastinal and right hilar lymphadenopathy has regressed. No new sites of disease are noted elsewhere in the chest, abdomen or pelvis.  10/04/17 CT Neck did not reveal any lymphadenopathy   Patient's spleen was described as normal in appearance on the 09/17/16 CT Abdomen/Pelvis. It has now grown to 13.5x7.7x16.3cm as of 10/04/17 CT A/P(estimated at 832m)  05/03/2018 CT C/A/P which revealed Single mildly enlarged right hilar lymph node and moderate splenomegaly may represent residual lymphoma. A right infrahilar lymph node which was previously enlarged is no longer enlarged. 2. Other imaging findings of potential clinical significance: Aortic Atherosclerosis. Coronary atherosclerosis. 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 Mild prostatomegaly. Lumbar impingement at L3-4 and L4-5. Right humeral lucency is likely an intraosseous ganglion as shown on prior MRI. Spleen volume wise appears  smaller.  PLAN -Patient's labs today were reviewed with him in detail CBC with stable WBC count of 12.8k.  Mild thrombocytopenia  with platelets of 127k which will need to be monitored since there is a downward trend.  Normal hemoglobin of 13.1 CMP unremarkable LDH within normal limits Patient has no lab or clinical evidence of lymphoma progression at this time No indication for retreatment of the patient's splenic marginal zone lymphoma at this time  FOLLOW UP: RTC with Dr Irene Limbo with labs in 6 months  All of the patient's questions were answered with apparent satisfaction. The patient knows to call the clinic with any problems, questions or concerns.  Sullivan Lone MD [place] AAHIVMS [facility] Hematology/Oncology Physician [facility]

## 2021-05-13 ENCOUNTER — Telehealth: Payer: Self-pay | Admitting: Hematology

## 2021-05-13 NOTE — Telephone Encounter (Signed)
Scheduled follow-up appointment per 2/6 los. Patient is aware. °

## 2021-08-12 DIAGNOSIS — R2689 Other abnormalities of gait and mobility: Secondary | ICD-10-CM | POA: Diagnosis not present

## 2021-08-12 DIAGNOSIS — I700 Atherosclerosis of aorta: Secondary | ICD-10-CM | POA: Diagnosis not present

## 2021-08-12 DIAGNOSIS — E782 Mixed hyperlipidemia: Secondary | ICD-10-CM | POA: Diagnosis not present

## 2021-08-12 DIAGNOSIS — J301 Allergic rhinitis due to pollen: Secondary | ICD-10-CM | POA: Diagnosis not present

## 2021-10-29 DIAGNOSIS — L814 Other melanin hyperpigmentation: Secondary | ICD-10-CM | POA: Diagnosis not present

## 2021-10-29 DIAGNOSIS — C44619 Basal cell carcinoma of skin of left upper limb, including shoulder: Secondary | ICD-10-CM | POA: Diagnosis not present

## 2021-10-29 DIAGNOSIS — Z85831 Personal history of malignant neoplasm of soft tissue: Secondary | ICD-10-CM | POA: Diagnosis not present

## 2021-10-29 DIAGNOSIS — Z87898 Personal history of other specified conditions: Secondary | ICD-10-CM | POA: Diagnosis not present

## 2021-10-29 DIAGNOSIS — Z85828 Personal history of other malignant neoplasm of skin: Secondary | ICD-10-CM | POA: Diagnosis not present

## 2021-10-29 DIAGNOSIS — D485 Neoplasm of uncertain behavior of skin: Secondary | ICD-10-CM | POA: Diagnosis not present

## 2021-10-29 DIAGNOSIS — L578 Other skin changes due to chronic exposure to nonionizing radiation: Secondary | ICD-10-CM | POA: Diagnosis not present

## 2021-10-29 DIAGNOSIS — L570 Actinic keratosis: Secondary | ICD-10-CM | POA: Diagnosis not present

## 2021-10-29 DIAGNOSIS — D225 Melanocytic nevi of trunk: Secondary | ICD-10-CM | POA: Diagnosis not present

## 2021-10-29 DIAGNOSIS — L821 Other seborrheic keratosis: Secondary | ICD-10-CM | POA: Diagnosis not present

## 2021-11-11 ENCOUNTER — Inpatient Hospital Stay: Payer: PPO | Attending: Hematology | Admitting: Hematology

## 2021-11-11 ENCOUNTER — Inpatient Hospital Stay: Payer: PPO

## 2021-11-11 ENCOUNTER — Other Ambulatory Visit: Payer: Self-pay

## 2021-11-11 VITALS — BP 118/63 | HR 74 | Temp 97.3°F | Resp 20 | Wt 195.3 lb

## 2021-11-11 DIAGNOSIS — Z86010 Personal history of colonic polyps: Secondary | ICD-10-CM | POA: Insufficient documentation

## 2021-11-11 DIAGNOSIS — G4730 Sleep apnea, unspecified: Secondary | ICD-10-CM | POA: Diagnosis not present

## 2021-11-11 DIAGNOSIS — E7800 Pure hypercholesterolemia, unspecified: Secondary | ICD-10-CM | POA: Insufficient documentation

## 2021-11-11 DIAGNOSIS — I700 Atherosclerosis of aorta: Secondary | ICD-10-CM | POA: Diagnosis not present

## 2021-11-11 DIAGNOSIS — C8582 Other specified types of non-Hodgkin lymphoma, intrathoracic lymph nodes: Secondary | ICD-10-CM | POA: Diagnosis not present

## 2021-11-11 DIAGNOSIS — C8300 Small cell B-cell lymphoma, unspecified site: Secondary | ICD-10-CM | POA: Insufficient documentation

## 2021-11-11 DIAGNOSIS — F17210 Nicotine dependence, cigarettes, uncomplicated: Secondary | ICD-10-CM | POA: Insufficient documentation

## 2021-11-11 DIAGNOSIS — Z85828 Personal history of other malignant neoplasm of skin: Secondary | ICD-10-CM | POA: Diagnosis not present

## 2021-11-11 DIAGNOSIS — I2510 Atherosclerotic heart disease of native coronary artery without angina pectoris: Secondary | ICD-10-CM | POA: Diagnosis not present

## 2021-11-11 LAB — CMP (CANCER CENTER ONLY)
ALT: 11 U/L (ref 0–44)
AST: 19 U/L (ref 15–41)
Albumin: 4.4 g/dL (ref 3.5–5.0)
Alkaline Phosphatase: 61 U/L (ref 38–126)
Anion gap: 6 (ref 5–15)
BUN: 14 mg/dL (ref 8–23)
CO2: 25 mmol/L (ref 22–32)
Calcium: 8.9 mg/dL (ref 8.9–10.3)
Chloride: 107 mmol/L (ref 98–111)
Creatinine: 1.12 mg/dL (ref 0.61–1.24)
GFR, Estimated: >60 mL/min (ref >60)
Glucose, Bld: 93 mg/dL (ref 70–99)
Potassium: 4.3 mmol/L (ref 3.5–5.1)
Sodium: 138 mmol/L (ref 135–145)
Total Bilirubin: 0.5 mg/dL (ref 0.3–1.2)
Total Protein: 6.5 g/dL (ref 6.5–8.1)

## 2021-11-11 LAB — CBC WITH DIFFERENTIAL/PLATELET
Abs Immature Granulocytes: 0.03 10*3/uL (ref 0.00–0.07)
Basophils Absolute: 0.1 10*3/uL (ref 0.0–0.1)
Basophils Relative: 1 %
Eosinophils Absolute: 0.3 10*3/uL (ref 0.0–0.5)
Eosinophils Relative: 2 %
HCT: 39.3 % (ref 39.0–52.0)
Hemoglobin: 13.5 g/dL (ref 13.0–17.0)
Immature Granulocytes: 0 %
Lymphocytes Relative: 56 %
Lymphs Abs: 6.9 10*3/uL — ABNORMAL HIGH (ref 0.7–4.0)
MCH: 30.1 pg (ref 26.0–34.0)
MCHC: 34.4 g/dL (ref 30.0–36.0)
MCV: 87.5 fL (ref 80.0–100.0)
Monocytes Absolute: 0.7 10*3/uL (ref 0.1–1.0)
Monocytes Relative: 5 %
Neutro Abs: 4.4 10*3/uL (ref 1.7–7.7)
Neutrophils Relative %: 36 %
Platelets: 181 10*3/uL (ref 150–400)
RBC: 4.49 MIL/uL (ref 4.22–5.81)
RDW: 13.5 % (ref 11.5–15.5)
Smear Review: NORMAL
WBC: 12.3 10*3/uL — ABNORMAL HIGH (ref 4.0–10.5)
nRBC: 0 % (ref 0.0–0.2)

## 2021-11-11 LAB — LACTATE DEHYDROGENASE: LDH: 145 U/L (ref 98–192)

## 2021-11-11 NOTE — Progress Notes (Signed)
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 He is accompanied today by his wife. The pt reports that he is doing well overall.   The pt had a blood flow cytometry on 09/11/16 which revealed a monoclonal B cell population, positive for CD20. A 09/11/16 FISH - CLL Panel was unremarkable save for 2.5% cells showing loss of 13q34 and 2% cells showing loss of p53. The pt has thus far been followed with repeat CT C/A/P every 3 months and last had a CT on 10/04/17 as noted below.   The pt reports that he has not developed any new concerns or symptoms since his last visit with Dr Lebron Conners in April.   The pt denies having ever received a blood transfusion nor has any tattoos.   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   He notes that he had the old shingles vaccine but had a shingles outbreak in December 2018.   Of note prior to the patient's visit today, pt has had CT C/A/P completed on 10/04/17 with results revealing Persistent splenomegaly. 2. Mediastinal and right hilar lymphadenopathy has regressed. 3. No new sites of disease are noted elsewhere in the chest, abdomen or pelvis. 4. Subtle inflammatory changes in the fat of the sigmoid mesocolon. This is of uncertain etiology and significance. Typically this is seen in the setting of diverticular disease, however, no adjacent colonic diverticulae are noted. Attention on routine imaging follow-up is recommended to ensure resolution. 5. Aortic atherosclerosis, in addition to left main and 3 vessel coronary artery  disease. Assessment for potential risk factor modification, dietary therapy or pharmacologic therapy may be warranted, if clinically indicated. 6. There are calcifications of the aortic valve. Echocardiographic correlation for evaluation of potential valvular dysfunction may be warranted if clinically indicated. 7. Additional incidental findings.   The pt also had a CT Soft Tissue Neck on 10/04/17 which revealed No evidence of cervical lymphadenopathy.   Most recent lab results (10/04/17) of CBC w/diff, CMP  is as follows: all values are WNL except for WBC at 11.7k, Lymphs abs at 6.4. LDH 10/04/17 is WNL at 187  On review of systems, pt reports stable energy levels, and denies fevers, chills, night sweats, unexpected weight loss, abdominal pains, chest pain, shortness of breath, leg swelling, and any other symptoms.   Interval History:   Kyle Sawyer is here for continued evaluation and management of his splenic marginal zone lymphoma.  He was last seen by clinic about 6 months ago.  Patient notes no acute new symptoms. No fever, chills, night sweats. No new lumps, bumps, or lesions/rashes. No new or unexpected weight loss. No other new or acute focal symptoms.  Labs done today were reviewed in detail.  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    SURGICAL HISTORY: Past Surgical History:  Procedure Laterality Date   HEMORRHOID SURGERY     HERNIA REPAIR     [facility] 2007   SKIN SURGERY     various unspecified    SOCIAL HISTORY: Social History   Socioeconomic History   Marital status:  Married    Spouse name: Not on file   Number of children: Not on file   Years of education: Not on file   Highest education level: Not on file  Occupational History   Occupation: Retired    Fish farm manager: [employer]  Tobacco Use   Smoking status: Every Day    Types: Pipe   Smokeless tobacco: Never  Vaping Use   Vaping Use: Never used  Substance and Sexual Activity   Alcohol use: No   Drug use: No   Sexual activity: Not on file  Other Topics Concern   Not on file  Social History Narrative   Not on file   Social Determinants of Health   Financial Resource Strain: Not on file  Food Insecurity: Not on file  Transportation Needs: Not on file  Physical Activity: Not on file  Stress: Not on file  Social Connections: Not on file  Intimate Partner Violence: Not on file    FAMILY HISTORY: No family history on file.  ALLERGIES:  has No Known Allergies.  MEDICATIONS:  Current Outpatient Medications  Medication Sig Dispense Refill   Cetirizine HCl (ZYRTEC PO) Take by mouth.     Coenzyme Q10 (COQ10 PO) Take 1 tablet by mouth daily.     FENOFIBRATE PO Take 45 mg by mouth daily.     rosuvastatin (CRESTOR) 5 MG tablet Take 5 mg by mouth daily.     No current facility-administered medications for this visit.    REVIEW OF SYSTEMS:   10 Point review of Systems was done is negative except as noted above.  PHYSICAL EXAMINATION: ECOG FS:2 - Symptomatic, <50% confined to bed  Vitals:   11/11/21 0919  BP: 118/63  Pulse: 74  Resp: 20  Temp: (!) 97.3 F (36.3 C)  SpO2: 100%    Wt Readings from Last 3 Encounters:  11/11/21 195 lb 4.8 oz (88.6 kg)  05/12/21 201 lb 14.4 oz (91.6 kg)  11/07/20 196 lb 8 oz (89.1 kg)   Body mass index is 28.02 kg/m.   NAD GENERAL:alert, in no acute distress and comfortable SKIN: no acute rashes, no significant lesions EYES: conjunctiva are pink and non-injected, sclera anicteric NECK: supple, no JVD LYMPH:  no palpable  lymphadenopathy in the cervical, axillary or inguinal regions LUNGS: clear to auscultation b/l with normal respiratory effort HEART: regular rate & rhythm ABDOMEN:  normoactive bowel sounds , non tender, not distended. Extremity: no pedal edema PSYCH: alert & oriented x 3 with fluent speech NEURO: no focal motor/sensory deficits  LABORATORY DATA:  I have reviewed the data as listed  .    Latest Ref Rng & Units 11/11/2021    8:40 AM 05/12/2021    8:55 AM 11/07/2020    9:05 AM  CBC  WBC 4.0 - 10.5 K/uL 12.3  12.8  14.2   Hemoglobin 13.0 - 17.0 g/dL 13.5  13.1  13.2   Hematocrit 39.0 - 52.0 % 39.3  38.8  38.8   Platelets 150 - 400 K/uL  181  127  144    CBC    Component Value Date/Time   WBC 12.3 (H) 11/11/2021 0840   RBC 4.49 11/11/2021 0840   HGB 13.5 11/11/2021 0840   HGB 13.1 05/12/2021 0855   HGB 13.9 04/07/2017 0939   HCT 39.3 11/11/2021 0840   HCT 41.8 04/07/2017 0939   PLT 181 11/11/2021 0840   PLT 127 (L) 05/12/2021 0855   PLT 149 04/07/2017 0939   MCV 87.5 11/11/2021 0840   MCV 91.7 04/07/2017 0939   MCH 30.1 11/11/2021 0840   MCHC 34.4 11/11/2021 0840   RDW 13.5 11/11/2021 0840   RDW 15.0 (H) 04/07/2017 0939   LYMPHSABS 6.9 (H) 11/11/2021 0840   LYMPHSABS 6.4 (H) 04/07/2017 0939   MONOABS 0.7 11/11/2021 0840   MONOABS 0.6 04/07/2017 0939   EOSABS 0.3 11/11/2021 0840   EOSABS 0.2 04/07/2017 0939   BASOSABS 0.1 11/11/2021 0840   BASOSABS 0.1 04/07/2017 0939     .    Latest Ref Rng & Units 11/11/2021    8:40 AM 05/12/2021    8:55 AM 11/07/2020    9:05 AM  CMP  Glucose 70 - 99 mg/dL 93  84  89   BUN 8 - 23 mg/dL 14  19  14    Creatinine 0.61 - 1.24 mg/dL 1.12  1.19  1.00   Sodium 135 - 145 mmol/L 138  141  141   Potassium 3.5 - 5.1 mmol/L 4.3  4.3  4.5   Chloride 98 - 111 mmol/L 107  109  109   CO2 22 - 32 mmol/L 25  25  25    Calcium 8.9 - 10.3 mg/dL 8.9  8.9  9.1   Total Protein 6.5 - 8.1 g/dL 6.5  6.2  6.0   Total Bilirubin 0.3 - 1.2 mg/dL 0.5  0.8  0.7    Alkaline Phos 38 - 126 U/L 61  51  54   AST 15 - 41 U/L 19  19  24    ALT 0 - 44 U/L 11  13  17     . Lab Results  Component Value Date   LDH 145 11/11/2021    09/11/16 Molecular Pathology:   09/11/16 Peripheral blood flow cytometry:      RADIOGRAPHIC STUDIES: I have personally reviewed the radiological images as listed and agreed with the findings in the report. No results found.  ASSESSMENT & PLAN:   81 y.o. male with  1. 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  Mediastinal and right hilar lymphadenopathy has regressed. No new sites of disease are noted elsewhere in the chest, abdomen or pelvis.  10/04/17 CT Neck did not reveal any lymphadenopathy   Patient's spleen was described as normal in appearance on the 09/17/16 CT Abdomen/Pelvis. It has now grown to 13.5x7.7x16.3cm as of 10/04/17 CT A/P(estimated at 845m)  05/03/2018 CT C/A/P which revealed Single mildly enlarged right hilar lymph node and moderate splenomegaly may represent residual lymphoma. A right infrahilar lymph node which was previously enlarged is no longer enlarged. 2. Other imaging findings of potential clinical significance: Aortic Atherosclerosis. Coronary atherosclerosis. 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 Mild prostatomegaly. Lumbar impingement at L3-4 and L4-5. Right humeral lucency is likely an intraosseous ganglion as shown on prior MRI. Spleen volume wise appears smaller.  PLAN -Patient's labs today were reviewed with him in detail  CBC with stable WBC count of 12.3k.  Platelets normalized at 181k. Normal hemoglobin of 13.5 Lymphocytosis stable at 6.9k down from 7.9k on previous labs. CMP unremarkable LDH within normal limits at 145 Patient has no lab or clinical evidence of symptomatic or significant lymphoma progression at this time No indication for  retreatment of the patient's splenic marginal zone lymphoma at this time  FOLLOW UP: RTC with Dr Irene Limbo with labs in 6 months  The total time spent in the appointment was 20 minutes*.  All of the patient's questions were answered with apparent satisfaction. The patient knows to call the clinic with any problems, questions or concerns.   Sullivan Lone MD MS AAHIVMS [facility] Hematology/Oncology Physician [facility]  .*Total Encounter Time as defined by the Centers for Medicare and Medicaid Services includes, in addition to the face-to-face time of a patient visit (documented in the note above) non-face-to-face time: obtaining and reviewing outside history, ordering and reviewing medications, tests or procedures, care coordination (communications with other health care professionals or caregivers) and documentation in the medical record.   I, Melene Muller, am acting as scribe for Dr. Sullivan Lone, MD.  .I have reviewed the above documentation for accuracy and completeness, and I agree with the above. Brunetta Genera MD

## 2021-11-14 ENCOUNTER — Ambulatory Visit
Admission: RE | Admit: 2021-11-14 | Discharge: 2021-11-14 | Disposition: A | Discharge status: Home / Self Care | Payer: PPO | Source: Ambulatory Visit | Attending: Internal Medicine | Admitting: Internal Medicine

## 2021-11-14 ENCOUNTER — Other Ambulatory Visit: Payer: Self-pay | Admitting: Internal Medicine

## 2021-11-14 DIAGNOSIS — J309 Allergic rhinitis, unspecified: Secondary | ICD-10-CM | POA: Diagnosis not present

## 2021-11-14 DIAGNOSIS — R052 Subacute cough: Secondary | ICD-10-CM

## 2021-11-14 DIAGNOSIS — R059 Cough, unspecified: Secondary | ICD-10-CM | POA: Diagnosis not present

## 2021-11-14 DIAGNOSIS — R0981 Nasal congestion: Secondary | ICD-10-CM | POA: Diagnosis not present

## 2021-12-02 DIAGNOSIS — Z86010 Personal history of colonic polyps: Secondary | ICD-10-CM | POA: Diagnosis not present

## 2021-12-02 DIAGNOSIS — Z79899 Other long term (current) drug therapy: Secondary | ICD-10-CM | POA: Diagnosis not present

## 2021-12-02 DIAGNOSIS — C8580 Other specified types of non-Hodgkin lymphoma, unspecified site: Secondary | ICD-10-CM | POA: Diagnosis not present

## 2021-12-02 DIAGNOSIS — R052 Subacute cough: Secondary | ICD-10-CM | POA: Diagnosis not present

## 2021-12-02 DIAGNOSIS — Z0000 Encounter for general adult medical examination without abnormal findings: Secondary | ICD-10-CM | POA: Diagnosis not present

## 2021-12-02 DIAGNOSIS — I700 Atherosclerosis of aorta: Secondary | ICD-10-CM | POA: Diagnosis not present

## 2021-12-02 DIAGNOSIS — H6122 Impacted cerumen, left ear: Secondary | ICD-10-CM | POA: Diagnosis not present

## 2021-12-02 DIAGNOSIS — D696 Thrombocytopenia, unspecified: Secondary | ICD-10-CM | POA: Diagnosis not present

## 2021-12-02 DIAGNOSIS — E782 Mixed hyperlipidemia: Secondary | ICD-10-CM | POA: Diagnosis not present

## 2021-12-02 DIAGNOSIS — J3089 Other allergic rhinitis: Secondary | ICD-10-CM | POA: Diagnosis not present

## 2021-12-10 DIAGNOSIS — H6123 Impacted cerumen, bilateral: Secondary | ICD-10-CM | POA: Diagnosis not present

## 2022-01-14 DIAGNOSIS — K648 Other hemorrhoids: Secondary | ICD-10-CM | POA: Diagnosis not present

## 2022-01-14 DIAGNOSIS — D124 Benign neoplasm of descending colon: Secondary | ICD-10-CM | POA: Diagnosis not present

## 2022-01-14 DIAGNOSIS — Z86010 Personal history of colonic polyps: Secondary | ICD-10-CM | POA: Diagnosis not present

## 2022-01-14 DIAGNOSIS — K644 Residual hemorrhoidal skin tags: Secondary | ICD-10-CM | POA: Diagnosis not present

## 2022-01-14 DIAGNOSIS — Z09 Encounter for follow-up examination after completed treatment for conditions other than malignant neoplasm: Secondary | ICD-10-CM | POA: Diagnosis not present

## 2022-01-16 DIAGNOSIS — D124 Benign neoplasm of descending colon: Secondary | ICD-10-CM | POA: Diagnosis not present

## 2022-01-23 DIAGNOSIS — R052 Subacute cough: Secondary | ICD-10-CM | POA: Diagnosis not present

## 2022-01-23 DIAGNOSIS — J3089 Other allergic rhinitis: Secondary | ICD-10-CM | POA: Diagnosis not present

## 2022-02-09 DIAGNOSIS — R053 Chronic cough: Secondary | ICD-10-CM | POA: Diagnosis not present

## 2022-03-05 ENCOUNTER — Institutional Professional Consult (permissible substitution): Payer: PPO | Admitting: Pulmonary Disease

## 2022-03-21 ENCOUNTER — Other Ambulatory Visit: Payer: Self-pay

## 2022-03-21 ENCOUNTER — Emergency Department (EMERGENCY_DEPARTMENT_HOSPITAL): Payer: PPO | Admitting: Certified Registered Nurse Anesthetist

## 2022-03-21 ENCOUNTER — Emergency Department (HOSPITAL_BASED_OUTPATIENT_CLINIC_OR_DEPARTMENT_OTHER): Payer: PPO

## 2022-03-21 ENCOUNTER — Encounter (HOSPITAL_BASED_OUTPATIENT_CLINIC_OR_DEPARTMENT_OTHER): Payer: Self-pay | Admitting: Emergency Medicine

## 2022-03-21 ENCOUNTER — Encounter (HOSPITAL_COMMUNITY)
Admission: EM | Disposition: A | Discharge status: Home / Self Care | Payer: Self-pay | Source: Home / Self Care | Attending: Emergency Medicine

## 2022-03-21 ENCOUNTER — Emergency Department (HOSPITAL_COMMUNITY): Payer: PPO | Admitting: Certified Registered Nurse Anesthetist

## 2022-03-21 ENCOUNTER — Ambulatory Visit (HOSPITAL_BASED_OUTPATIENT_CLINIC_OR_DEPARTMENT_OTHER)
Admission: EM | Admit: 2022-03-21 | Discharge: 2022-03-21 | Disposition: A | Discharge status: Home / Self Care | Payer: PPO | Attending: General Surgery | Admitting: General Surgery

## 2022-03-21 DIAGNOSIS — E7800 Pure hypercholesterolemia, unspecified: Secondary | ICD-10-CM | POA: Insufficient documentation

## 2022-03-21 DIAGNOSIS — K8012 Calculus of gallbladder with acute and chronic cholecystitis without obstruction: Secondary | ICD-10-CM | POA: Insufficient documentation

## 2022-03-21 DIAGNOSIS — F17290 Nicotine dependence, other tobacco product, uncomplicated: Secondary | ICD-10-CM | POA: Diagnosis not present

## 2022-03-21 DIAGNOSIS — K8010 Calculus of gallbladder with chronic cholecystitis without obstruction: Secondary | ICD-10-CM | POA: Diagnosis not present

## 2022-03-21 DIAGNOSIS — R109 Unspecified abdominal pain: Secondary | ICD-10-CM | POA: Diagnosis not present

## 2022-03-21 DIAGNOSIS — K8020 Calculus of gallbladder without cholecystitis without obstruction: Secondary | ICD-10-CM | POA: Diagnosis not present

## 2022-03-21 DIAGNOSIS — K7689 Other specified diseases of liver: Secondary | ICD-10-CM | POA: Insufficient documentation

## 2022-03-21 DIAGNOSIS — G4733 Obstructive sleep apnea (adult) (pediatric): Secondary | ICD-10-CM | POA: Diagnosis not present

## 2022-03-21 DIAGNOSIS — K810 Acute cholecystitis: Secondary | ICD-10-CM

## 2022-03-21 DIAGNOSIS — I700 Atherosclerosis of aorta: Secondary | ICD-10-CM | POA: Insufficient documentation

## 2022-03-21 DIAGNOSIS — K819 Cholecystitis, unspecified: Secondary | ICD-10-CM

## 2022-03-21 DIAGNOSIS — N400 Enlarged prostate without lower urinary tract symptoms: Secondary | ICD-10-CM | POA: Insufficient documentation

## 2022-03-21 DIAGNOSIS — R1013 Epigastric pain: Secondary | ICD-10-CM | POA: Diagnosis not present

## 2022-03-21 HISTORY — PX: CHOLECYSTECTOMY: SHX55

## 2022-03-21 LAB — COMPREHENSIVE METABOLIC PANEL
ALT: 13 U/L (ref 0–44)
AST: 17 U/L (ref 15–41)
Albumin: 4.6 g/dL (ref 3.5–5.0)
Alkaline Phosphatase: 52 U/L (ref 38–126)
Anion gap: 9 (ref 5–15)
BUN: 18 mg/dL (ref 8–23)
CO2: 27 mmol/L (ref 22–32)
Calcium: 9.8 mg/dL (ref 8.9–10.3)
Chloride: 104 mmol/L (ref 98–111)
Creatinine, Ser: 1.09 mg/dL (ref 0.61–1.24)
GFR, Estimated: >60 mL/min (ref >60)
Glucose, Bld: 151 mg/dL — ABNORMAL HIGH (ref 70–99)
Potassium: 3.9 mmol/L (ref 3.5–5.1)
Sodium: 140 mmol/L (ref 135–145)
Total Bilirubin: 0.7 mg/dL (ref 0.3–1.2)
Total Protein: 6.6 g/dL (ref 6.5–8.1)

## 2022-03-21 LAB — CBC
HCT: 40.2 % (ref 39.0–52.0)
Hemoglobin: 13.5 g/dL (ref 13.0–17.0)
MCH: 29.9 pg (ref 26.0–34.0)
MCHC: 33.6 g/dL (ref 30.0–36.0)
MCV: 89.1 fL (ref 80.0–100.0)
Platelets: 160 10*3/uL (ref 150–400)
RBC: 4.51 MIL/uL (ref 4.22–5.81)
RDW: 14.3 % (ref 11.5–15.5)
WBC: 15.3 10*3/uL — ABNORMAL HIGH (ref 4.0–10.5)
nRBC: 0 % (ref 0.0–0.2)

## 2022-03-21 LAB — PROTIME-INR
INR: 1.1 (ref 0.8–1.2)
Prothrombin Time: 14.1 seconds (ref 11.4–15.2)

## 2022-03-21 LAB — LIPASE, BLOOD: Lipase: 20 U/L (ref 11–51)

## 2022-03-21 SURGERY — LAPAROSCOPIC CHOLECYSTECTOMY
Anesthesia: General | Site: Abdomen

## 2022-03-21 MED ORDER — FENTANYL CITRATE (PF) 250 MCG/5ML IJ SOLN
INTRAMUSCULAR | Status: DC | PRN
  Administered 2022-03-21: 50 ug via INTRAVENOUS
  Administered 2022-03-21: 100 ug via INTRAVENOUS

## 2022-03-21 MED ORDER — SODIUM CHLORIDE 0.9 % IV SOLN: Freq: Once | INTRAVENOUS | Status: AC

## 2022-03-21 MED ORDER — DEXAMETHASONE SODIUM PHOSPHATE 10 MG/ML IJ SOLN
INTRAMUSCULAR | Status: DC | PRN
  Administered 2022-03-21: 4 mg via INTRAVENOUS

## 2022-03-21 MED ORDER — SUGAMMADEX SODIUM 200 MG/2ML IV SOLN
INTRAVENOUS | Status: DC | PRN
  Administered 2022-03-21: 350 mg via INTRAVENOUS

## 2022-03-21 MED ORDER — SODIUM CHLORIDE 0.9 % IR SOLN
Status: DC | PRN
  Administered 2022-03-21: 1000 mL

## 2022-03-21 MED ORDER — EPHEDRINE 5 MG/ML INJ
INTRAVENOUS | Status: AC
  Filled 2022-03-21: qty 5

## 2022-03-21 MED ORDER — FAMOTIDINE IN NACL 20-0.9 MG/50ML-% IV SOLN
20.0000 mg | Freq: Once | INTRAVENOUS | Status: AC
  Administered 2022-03-21: 20 mg via INTRAVENOUS
  Filled 2022-03-21: qty 50

## 2022-03-21 MED ORDER — EPHEDRINE SULFATE-NACL 50-0.9 MG/10ML-% IV SOSY
PREFILLED_SYRINGE | INTRAVENOUS | Status: DC | PRN
  Administered 2022-03-21: 10 mg via INTRAVENOUS

## 2022-03-21 MED ORDER — IOHEXOL 300 MG/ML  SOLN
80.0000 mL | Freq: Once | INTRAMUSCULAR | Status: AC | PRN
  Administered 2022-03-21: 80 mL via INTRAVENOUS

## 2022-03-21 MED ORDER — FENTANYL CITRATE (PF) 100 MCG/2ML IJ SOLN: 25.0000 ug | INTRAMUSCULAR | Status: DC | PRN

## 2022-03-21 MED ORDER — BUPIVACAINE HCL (PF) 0.25 % IJ SOLN
INTRAMUSCULAR | Status: AC
  Filled 2022-03-21: qty 30

## 2022-03-21 MED ORDER — CHLORHEXIDINE GLUCONATE 0.12 % MT SOLN: 15.0000 mL | Freq: Once | OROMUCOSAL | Status: AC

## 2022-03-21 MED ORDER — ONDANSETRON HCL 4 MG/2ML IJ SOLN
INTRAMUSCULAR | Status: AC
  Filled 2022-03-21: qty 2

## 2022-03-21 MED ORDER — LIDOCAINE 2% (20 MG/ML) 5 ML SYRINGE
INTRAMUSCULAR | Status: AC
  Filled 2022-03-21: qty 5

## 2022-03-21 MED ORDER — ROCURONIUM BROMIDE 10 MG/ML (PF) SYRINGE
PREFILLED_SYRINGE | INTRAVENOUS | Status: DC | PRN
  Administered 2022-03-21: 50 mg via INTRAVENOUS

## 2022-03-21 MED ORDER — 0.9 % SODIUM CHLORIDE (POUR BTL) OPTIME
TOPICAL | Status: DC | PRN
  Administered 2022-03-21: 1000 mL

## 2022-03-21 MED ORDER — CHLORHEXIDINE GLUCONATE 0.12 % MT SOLN
OROMUCOSAL | Status: AC
  Administered 2022-03-21: 15 mL via OROMUCOSAL
  Filled 2022-03-21: qty 15

## 2022-03-21 MED ORDER — ROCURONIUM BROMIDE 10 MG/ML (PF) SYRINGE
PREFILLED_SYRINGE | INTRAVENOUS | Status: AC
  Filled 2022-03-21: qty 10

## 2022-03-21 MED ORDER — TRAMADOL HCL 50 MG PO TABS: 50.0000 mg | ORAL_TABLET | Freq: Four times a day (QID) | ORAL | 0 refills | Status: DC | PRN

## 2022-03-21 MED ORDER — HYDROMORPHONE HCL 1 MG/ML IJ SOLN
0.5000 mg | Freq: Once | INTRAMUSCULAR | Status: AC
  Administered 2022-03-21: 0.5 mg via INTRAVENOUS
  Filled 2022-03-21: qty 1

## 2022-03-21 MED ORDER — PROPOFOL 10 MG/ML IV BOLUS
INTRAVENOUS | Status: AC
  Filled 2022-03-21: qty 20

## 2022-03-21 MED ORDER — ACETAMINOPHEN 10 MG/ML IV SOLN: 1000.0000 mg | Freq: Once | INTRAVENOUS | Status: DC | PRN

## 2022-03-21 MED ORDER — ONDANSETRON HCL 4 MG/2ML IJ SOLN
INTRAMUSCULAR | Status: DC | PRN
  Administered 2022-03-21: 4 mg via INTRAVENOUS

## 2022-03-21 MED ORDER — FENTANYL CITRATE (PF) 250 MCG/5ML IJ SOLN
INTRAMUSCULAR | Status: AC
  Filled 2022-03-21: qty 5

## 2022-03-21 MED ORDER — PROPOFOL 10 MG/ML IV BOLUS
INTRAVENOUS | Status: DC | PRN
  Administered 2022-03-21: 140 mg via INTRAVENOUS

## 2022-03-21 MED ORDER — LACTATED RINGERS IV SOLN: INTRAVENOUS | Status: DC

## 2022-03-21 MED ORDER — SODIUM CHLORIDE 0.9 % IV SOLN
2.0000 g | Freq: Once | INTRAVENOUS | Status: AC
  Administered 2022-03-21: 2 g via INTRAVENOUS
  Filled 2022-03-21: qty 20

## 2022-03-21 MED ORDER — LIDOCAINE 2% (20 MG/ML) 5 ML SYRINGE
INTRAMUSCULAR | Status: DC | PRN
  Administered 2022-03-21: 60 mg via INTRAVENOUS

## 2022-03-21 MED ORDER — DEXAMETHASONE SODIUM PHOSPHATE 10 MG/ML IJ SOLN
INTRAMUSCULAR | Status: AC
  Filled 2022-03-21: qty 1

## 2022-03-21 MED ORDER — HEMOSTATIC AGENTS (NO CHARGE) OPTIME
TOPICAL | Status: DC | PRN
  Administered 2022-03-21: 2 via TOPICAL

## 2022-03-21 MED ORDER — BUPIVACAINE HCL 0.25 % IJ SOLN
INTRAMUSCULAR | Status: DC | PRN
  Administered 2022-03-21: 7.5 mL

## 2022-03-21 MED ORDER — ORAL CARE MOUTH RINSE: 15.0000 mL | Freq: Once | OROMUCOSAL | Status: AC

## 2022-03-21 SURGICAL SUPPLY — 45 items
ADH SKN CLS APL DERMABOND .7 (GAUZE/BANDAGES/DRESSINGS) ×1
APL PRP STRL LF DISP 70% ISPRP (MISCELLANEOUS) ×1
BAG COUNTER SPONGE SURGICOUNT (BAG) ×1 IMPLANT
BAG SPEC RTRVL 10 TROC 200 (ENDOMECHANICALS) ×1
BAG SPNG CNTER NS LX DISP (BAG) ×1
CANISTER SUCT 3000ML PPV (MISCELLANEOUS) ×1 IMPLANT
CHLORAPREP W/TINT 26 (MISCELLANEOUS) ×1 IMPLANT
CLIP LIGATING HEMO O LOK GREEN (MISCELLANEOUS) ×1 IMPLANT
COVER SURGICAL LIGHT HANDLE (MISCELLANEOUS) ×1 IMPLANT
COVER TRANSDUCER ULTRASND (DRAPES) ×1 IMPLANT
DERMABOND ADVANCED .7 DNX12 (GAUZE/BANDAGES/DRESSINGS) ×1 IMPLANT
ELECT REM PT RETURN 9FT ADLT (ELECTROSURGICAL) ×1
ELECTRODE REM PT RTRN 9FT ADLT (ELECTROSURGICAL) ×1 IMPLANT
GLOVE BIO SURGEON STRL SZ7.5 (GLOVE) ×1 IMPLANT
GLOVE SURG SYN 7.5  E (GLOVE)
GLOVE SURG SYN 7.5 E (GLOVE) IMPLANT
GLOVE SURG SYN 7.5 PF PI (GLOVE) ×1 IMPLANT
GOWN STRL REUS W/ TWL LRG LVL3 (GOWN DISPOSABLE) ×2 IMPLANT
GOWN STRL REUS W/ TWL XL LVL3 (GOWN DISPOSABLE) ×1 IMPLANT
GOWN STRL REUS W/TWL LRG LVL3 (GOWN DISPOSABLE) ×2
GOWN STRL REUS W/TWL XL LVL3 (GOWN DISPOSABLE) ×1
GRASPER SUT TROCAR 14GX15 (MISCELLANEOUS) ×1 IMPLANT
HEMOSTAT SNOW SURGICEL 2X4 (HEMOSTASIS) IMPLANT
KIT BASIN OR (CUSTOM PROCEDURE TRAY) ×1 IMPLANT
KIT TURNOVER KIT B (KITS) ×1 IMPLANT
NDL INSUFFLATION 14GA 120MM (NEEDLE) ×1 IMPLANT
NEEDLE INSUFFLATION 14GA 120MM (NEEDLE) ×1 IMPLANT
NS IRRIG 1000ML POUR BTL (IV SOLUTION) ×1 IMPLANT
PAD ARMBOARD 7.5X6 YLW CONV (MISCELLANEOUS) ×1 IMPLANT
POUCH LAPAROSCOPIC INSTRUMENT (MISCELLANEOUS) ×1 IMPLANT
POUCH RETRIEVAL ECOSAC 10 (ENDOMECHANICALS) IMPLANT
POUCH RETRIEVAL ECOSAC 10MM (ENDOMECHANICALS) ×1
SCISSORS LAP 5X35 DISP (ENDOMECHANICALS) ×1 IMPLANT
SET IRRIG TUBING LAPAROSCOPIC (IRRIGATION / IRRIGATOR) ×1 IMPLANT
SET TUBE SMOKE EVAC HIGH FLOW (TUBING) ×1 IMPLANT
SLEEVE ENDOPATH XCEL 5M (ENDOMECHANICALS) ×1 IMPLANT
SPECIMEN JAR SMALL (MISCELLANEOUS) ×1 IMPLANT
SUT MNCRL AB 4-0 PS2 18 (SUTURE) ×1 IMPLANT
TOWEL GREEN STERILE (TOWEL DISPOSABLE) ×1 IMPLANT
TOWEL GREEN STERILE FF (TOWEL DISPOSABLE) ×1 IMPLANT
TRAY LAPAROSCOPIC MC (CUSTOM PROCEDURE TRAY) ×1 IMPLANT
TROCAR XCEL NON-BLD 11X100MML (ENDOMECHANICALS) ×1 IMPLANT
TROCAR Z-THREAD OPTICAL 5X100M (TROCAR) ×1 IMPLANT
WARMER LAPAROSCOPE (MISCELLANEOUS) ×1 IMPLANT
WATER STERILE IRR 1000ML POUR (IV SOLUTION) ×1 IMPLANT

## 2022-03-21 NOTE — Anesthesia Procedure Notes (Signed)
Procedure Name: Intubation Date/Time: 03/21/2022 3:46 PM  Performed by: Clearnce Sorrel, CRNAPre-anesthesia Checklist: Patient identified, Emergency Drugs available, Suction available and Patient being monitored Patient Re-evaluated:Patient Re-evaluated prior to induction Oxygen Delivery Method: Circle System Utilized Preoxygenation: Pre-oxygenation with 100% oxygen Induction Type: IV induction Ventilation: Mask ventilation without difficulty Laryngoscope Size: Mac and 4 Grade View: Grade III Tube type: Oral Tube size: 7.5 mm Number of attempts: 1 Airway Equipment and Method: Stylet and Oral airway Placement Confirmation: positive ETCO2 and breath sounds checked- equal and bilateral Secured at: 23 cm Tube secured with: Tape Dental Injury: Teeth and Oropharynx as per pre-operative assessment

## 2022-03-21 NOTE — H&P (Signed)
Kyle Sawyer is an 81 y.o. male.   Chief Complaint: abdominal pain HPI: Pt is a 28M with pain under under his both ribs, yesterday at 9pm.  He states this was after attending a [place] party. Pt states the pain was through the night.  Denies n/v.  Went to ED for con't pain.  Pt had CT and [place] which showed stones in the gallbladder.  Pt with leukocytosis.  Normal LFTs.  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 No pertinent family history. Social History:  reports that he has been smoking pipe. He has never used smokeless tobacco. He reports that he does not drink alcohol and does not use drugs.  Allergies:  Allergies  Allergen Reactions   Oseltamivir Rash    (Not in a hospital admission)   Results for orders placed or performed during the hospital encounter of 03/21/22 (from the past 48 hour(s))  Lipase, blood     Status: None   Collection Time: 03/21/22  7:53 AM  Result Value Ref Range   Lipase 20 11 - 51 U/L    Comment: Performed at [facility], [place], [place] [address]  Comprehensive metabolic panel     Status: Abnormal   Collection Time: 03/21/22  7:53 AM  Result Value Ref Range   Sodium 140 135 - 145 mmol/L   Potassium 3.9 3.5 - 5.1 mmol/L   Chloride 104 98 - 111 mmol/L   CO2 27 22 - 32 mmol/L   Glucose,  Bld 151 (H) 70 - 99 mg/dL    Comment: Glucose reference range applies only to samples taken after fasting for at least 8 hours.   BUN 18 8 - 23 mg/dL   Creatinine, Ser 1.09 0.61 - 1.24 mg/dL   Calcium 9.8 8.9 - 10.3 mg/dL   Total Protein 6.6 6.5 - 8.1 g/dL   Albumin 4.6 3.5 - 5.0 g/dL   AST 17 15 - 41 U/L   ALT 13 0 - 44 U/L   Alkaline Phosphatase 52 38 - 126 U/L   Total Bilirubin 0.7 0.3 - 1.2 mg/dL   GFR, Estimated >60 >60 mL/min    Comment: (NOTE) Calculated using the CKD-EPI Creatinine Equation (2021)    Anion gap 9 5 - 15    Comment: Performed at [facility], [address] [place], [place] [address]  CBC     Status: Abnormal   Collection Time: 03/21/22  7:53 AM  Result Value Ref Range   WBC 15.3 (H) 4.0 - 10.5 K/uL   RBC 4.51 4.22 - 5.81 MIL/uL   Hemoglobin 13.5 13.0 - 17.0 g/dL   HCT 40.2 39.0 - 52.0 %   MCV 89.1 80.0 - 100.0  fL   MCH 29.9 26.0 - 34.0 pg   MCHC 33.6 30.0 - 36.0 g/dL   RDW 14.3 11.5 - 15.5 %   Platelets 160 150 - 400 K/uL   nRBC 0.0 0.0 - 0.2 %    Comment: Performed at [facility], [address], [place], [place] [address]   US Abdomen Limited RUQ (LIVER/GB)  Result Date: 03/21/2022 CLINICAL DATA:  81 year old male with abdominal pain. EXAM: ULTRASOUND ABDOMEN LIMITED RIGHT UPPER QUADRANT COMPARISON:  None Available. FINDINGS: Gallbladder: Mobile gallstones are noted as well as a small amount of gallbladder sludge. The gallbladder wall is UPPER limits of normal measuring 3 mm. Equivocal sonographic Murphy sign noted. Common bile duct: Diameter: 3 mm. There is no evidence of intrahepatic or extrahepatic biliary dilatation. Liver: Hepatic cysts are noted. No suspicious focal hepatic abnormalities are noted. The liver is otherwise unremarkable. Echogenicity is within normal limits. Portal vein is patent on color Doppler imaging with normal direction of blood flow towards the liver. Other: None. IMPRESSION: 1.  Cholelithiasis, gallbladder sludge, UPPER limits of normal gallbladder wall thickness and equivocal sonographic Murphy sign - question early acute cholecystitis. 2. No biliary dilatation. 3. No significant hepatic abnormality. Electronically Signed   By: Margarette Canada M.D.   On: 03/21/2022 12:32   CT Abdomen Pelvis W Contrast  Result Date: 03/21/2022 CLINICAL DATA:  Epigastric pain and back pain beginning yesterday. EXAM: CT ABDOMEN AND PELVIS WITH CONTRAST TECHNIQUE: Multidetector CT imaging of the abdomen and pelvis was performed using the standard protocol following bolus administration of intravenous contrast. RADIATION DOSE REDUCTION: This exam was performed according to the departmental dose-optimization program which includes automated exposure control, adjustment of the mA and/or kV according to patient size and/or use of iterative reconstruction technique. CONTRAST:  64m OMNIPAQUE IOHEXOL 300 MG/ML  SOLN COMPARISON:  05/03/2018 FINDINGS: Lower Chest: Increased bibasilar scarring. A new 9 mm pulmonary nodule is seen in the posterolateral right lower lobe on image 11/4. Hepatobiliary: Stable hepatic cysts. No hepatic masses identified. Tiny calcified gallstone noted, however there is no evidence of cholecystitis or biliary ductal dilatation. Pancreas:  No mass or inflammatory changes. Spleen: Stable mild splenomegaly.  Splenic masses identified. Adrenals/Urinary Tract: No suspicious masses identified. No evidence of ureteral calculi or hydronephrosis. Stomach/Bowel: No evidence of obstruction, inflammatory process or abnormal fluid collections. Vascular/Lymphatic: No pathologically enlarged lymph nodes. No acute vascular findings. Aortic atherosclerotic calcification incidentally noted. Reproductive:  Stable mildly enlarged prostate. Other:  None. Musculoskeletal:  No suspicious bone lesions identified. IMPRESSION: No acute findings within the abdomen or pelvis. Stable chronic mild splenomegaly.  Cholelithiasis. No radiographic evidence of cholecystitis. Stable mildly enlarged prostate. 9 mm right lower lobe pulmonary nodule. Consider one of the following in 3 months for both low-risk and high-risk individuals: (a) repeat chest CT, (b) follow-up PET-CT, or (c) tissue sampling. This recommendation follows the consensus statement: Guidelines for Management of Incidental Pulmonary Nodules Detected on CT Images: From the Fleischner Society 2017; Radiology 2017; 284:228-243. Aortic Atherosclerosis (ICD10-I70.0). Electronically Signed   By: JMarlaine HindM.D.   On: 03/21/2022 10:49    Review of Systems  Constitutional:  Negative for chills and fever.  HENT:  Negative for ear discharge, hearing loss and sore throat.   Eyes:  Negative for discharge.  Respiratory:  Negative for cough and shortness of breath.   Cardiovascular:  Negative for chest pain and leg swelling.  Gastrointestinal:  Positive for abdominal pain. Negative for constipation, diarrhea, nausea and vomiting.  Musculoskeletal:  Negative for myalgias and neck pain.  Skin:  Negative for rash.  Allergic/Immunologic: Negative for environmental allergies.  Neurological:  Negative for dizziness and seizures.  Hematological:  Does not bruise/bleed easily.  Psychiatric/Behavioral:  Negative for suicidal ideas.   All other systems reviewed and are negative.   Blood pressure 133/74, pulse (!) 46, temperature 97.9 F (36.6 C), temperature source Oral, resp. rate 17, height '5\' 10"'$  (1.778 m), weight 88 kg, SpO2 96 %. Physical Exam Constitutional:      Appearance: He is well-developed.     Comments: Conversant No acute distress  HENT:     Head: Normocephalic and atraumatic.  Eyes:     General: Lids are normal. No scleral icterus.    Pupils: Pupils are equal, round, and reactive to light.     Comments: Pupils are equal round and reactive No lid lag Moist conjunctiva  Neck:     Thyroid: No thyromegaly.     Trachea: No tracheal  tenderness.     Comments: No cervical lymphadenopathy Cardiovascular:     Rate and Rhythm: Normal rate and regular rhythm.     Heart sounds: No murmur heard. Pulmonary:     Effort: Pulmonary effort is normal.     Breath sounds: Normal breath sounds. No wheezing or rales.  Abdominal:     Tenderness: There is abdominal tenderness in the right upper quadrant.     Hernia: No hernia is present.  Musculoskeletal:     Cervical back: Normal range of motion and neck supple.  Skin:    General: Skin is warm.     Findings: No rash.     Nails: There is no clubbing.     Comments: Normal skin turgor  Neurological:     Mental Status: He is alert and oriented to person, place, and time.     Comments: Normal gait and station  Psychiatric:        Mood and Affect: Mood normal.        Thought Content: Thought content normal.        Judgment: Judgment normal.     Comments: Appropriate affect      Assessment/Plan 50M w/ acute cholecystitis To OR for lap chole. All risks and benefits were discussed with the patient to generally include: infection, bleeding, possible need for post op ERCP, damage to the bile ducts, and bile leak. Alternatives were offered and described.  All questions were answered and the patient voiced understanding of the procedure and wishes to proceed at this point with a laparoscopic cholecystectomy   Ralene Ok, MD 03/21/2022, 1:22 PM

## 2022-03-21 NOTE — ED Provider Notes (Addendum)
[facility] EMERGENCY DEPT Provider Note   CSN: [id number] Arrival date & time: 03/21/22  7253     History  Chief Complaint  Patient presents with   Abdominal Pain    Kyle Sawyer is a 81 y.o. male.  HPI Patient reports pain in his upper abdomen and under both ribs.  It started at approximately 9 PM yesterday evening.  He has had pain all night long.  It has a deep aching quality.  No associated vomiting, diarrhea or constipation.  Patient was feeling well yesterday.  He and his wife attended a Christmas party and he ate a lot of "junk food".  No prior history of gallbladder stones or disease that he is aware of.  Patient has never had cholecystectomy.  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    Home Medications Prior to Admission medications   Medication Sig Start Date End Date Taking? Authorizing Provider  Cetirizine HCl (ZYRTEC PO) Take by mouth.    [provider]  Coenzyme Q10 (COQ10 PO) Take 1 tablet by mouth daily.    [provider]  FENOFIBRATE PO Take 45 mg by mouth daily.    [provider]  rosuvastatin (CRESTOR) 5 MG tablet Take 5 mg by mouth daily. 10/14/20   [provider]      Allergies    Oseltamivir    Review of Systems   Review of Systems  Physical Exam Updated Vital Signs BP 137/71   Pulse (!) 42   Temp 97.9 F (36.6 C) (Oral)   Resp 16   Ht '5\' 10"'$  (1.778 m)   Wt 88 kg   SpO2 96%   BMI 27.84 kg/m  Physical Exam Constitutional:      Comments: Alert nontoxic clinically well in appearance.  HENT:     Mouth/Throat:     Pharynx: Oropharynx is clear.  Eyes:     Extraocular Movements: Extraocular movements intact.  Cardiovascular:     Comments: Bradycardia.  Regular.  No gross rub murmur gallop. Pulmonary:     Effort: Pulmonary effort is normal.     Breath sounds: Normal breath sounds.  Abdominal:      Comments: Abdomen soft.  Moderate pain to palpation deep right upper quadrant.  No appreciable splenomegaly on exam.  Lower abdomen nontender.  Musculoskeletal:        General: No swelling or tenderness. Normal range of motion.     Right lower leg: No edema.     Left lower leg: No edema.  Skin:    General: Skin is warm and dry.  Neurological:     General: No focal deficit present.     Mental Status: He is oriented to person, place, and time.     Coordination: Coordination normal.  Psychiatric:        Mood and Affect: Mood normal.     ED Results / Procedures / Treatments   Labs (all labs ordered are listed, but only abnormal results are displayed) Labs Reviewed  COMPREHENSIVE METABOLIC PANEL - Abnormal; Notable for the following components:      Result Value   Glucose, Bld 151 (*)    All other components within normal limits  CBC - Abnormal; Notable for the following components:   WBC 15.3 (*)    All other components within normal limits  LIPASE, BLOOD  URINALYSIS, ROUTINE W REFLEX MICROSCOPIC    EKG None  Radiology US Abdomen Limited RUQ (LIVER/GB)  Result Date: 03/21/2022 CLINICAL DATA:  81 year old male with abdominal pain. EXAM: ULTRASOUND ABDOMEN LIMITED RIGHT UPPER QUADRANT COMPARISON:  None Available. FINDINGS: Gallbladder: Mobile gallstones are noted as well as a small amount of gallbladder sludge. The gallbladder wall is UPPER limits of normal measuring 3 mm. Equivocal sonographic Murphy sign noted. Common bile duct: Diameter: 3 mm. There is no evidence of intrahepatic or extrahepatic biliary dilatation. Liver: Hepatic cysts are noted. No suspicious focal hepatic abnormalities are noted. The liver is otherwise unremarkable. Echogenicity is within normal limits. Portal vein is patent on color Doppler imaging with normal direction of blood flow towards the liver. Other: None. IMPRESSION: 1. Cholelithiasis, gallbladder sludge, UPPER limits of normal gallbladder wall  thickness and equivocal sonographic Murphy sign - question early acute cholecystitis. 2. No biliary dilatation. 3. No significant hepatic abnormality. Electronically Signed   By: Margarette Canada M.D.   On: 03/21/2022 12:32   CT Abdomen Pelvis W Contrast  Result Date: 03/21/2022 CLINICAL DATA:  Epigastric pain and back pain beginning yesterday. EXAM: CT ABDOMEN AND PELVIS WITH CONTRAST TECHNIQUE: Multidetector CT imaging of the abdomen and pelvis was performed using the standard protocol following bolus administration of intravenous contrast. RADIATION DOSE REDUCTION: This exam was performed according to the departmental dose-optimization program which includes automated exposure control, adjustment of the mA and/or kV according to patient size and/or use of iterative reconstruction technique. CONTRAST:  26m OMNIPAQUE IOHEXOL 300 MG/ML  SOLN COMPARISON:  05/03/2018 FINDINGS: Lower Chest: Increased bibasilar scarring. A new 9 mm pulmonary nodule is seen in the posterolateral right lower lobe on image 11/4. Hepatobiliary: Stable hepatic cysts. No hepatic masses identified. Tiny calcified gallstone noted, however there is no evidence of cholecystitis or biliary ductal dilatation. Pancreas:  No mass or inflammatory changes. Spleen: Stable mild splenomegaly.  Splenic masses identified. Adrenals/Urinary Tract: No suspicious masses identified. No evidence of ureteral calculi or hydronephrosis. Stomach/Bowel: No evidence of obstruction, inflammatory process or abnormal fluid collections. Vascular/Lymphatic: No pathologically enlarged lymph nodes. No acute vascular findings. Aortic atherosclerotic calcification incidentally noted. Reproductive:  Stable mildly enlarged prostate. Other:  None. Musculoskeletal:  No suspicious bone lesions identified. IMPRESSION: No acute findings within the abdomen or pelvis. Stable chronic mild splenomegaly. Cholelithiasis. No radiographic evidence of cholecystitis. Stable mildly enlarged  prostate. 9 mm right lower lobe pulmonary nodule. Consider one of the following in 3 months for both low-risk and high-risk individuals: (a) repeat chest CT, (b) follow-up PET-CT, or (c) tissue sampling. This recommendation follows the consensus statement: Guidelines for Management of Incidental Pulmonary Nodules Detected on CT Images: From the Fleischner Society 2017; Radiology 2017; 284:228-243. Aortic Atherosclerosis (ICD10-I70.0). Electronically Signed   By: JMarlaine HindM.D.   On: 03/21/2022 10:49    Procedures Procedures    Medications Ordered in ED Medications  cefTRIAXone (ROCEPHIN) 2 g in sodium chloride 0.9 % 100 mL IVPB (has no administration in time range)  0.9 %  sodium chloride infusion ( Intravenous New Bag/Given 03/21/22 0927)  HYDROmorphone (DILAUDID) injection 0.5 mg (0.5 mg Intravenous Given 03/21/22 0927)  iohexol (OMNIPAQUE) 300 MG/ML solution 80 mL (80 mLs Intravenous Contrast Given 03/21/22 0931)  famotidine (PEPCID) IVPB 20 mg premix (20 mg Intravenous New Bag/Given 03/21/22 1148)  HYDROmorphone (DILAUDID) injection 0.5 mg (0.5 mg Intravenous Given 03/21/22 1158)    ED Course/ Medical Decision Making/ A&P  Medical Decision Making Amount and/or Complexity of Data Reviewed Labs: ordered. Radiology: ordered.  Risk Prescription drug management. Decision regarding hospitalization.   Patient presents with onset of abdominal pain yesterday evening.  Continuous in nature.  No history of significant persistent abdominal pain.  He does have history of splenic lymphoma but stable.  Per oncology office visit [phone]: Patient has no lab or clinical evidence of symptomatic or significant lymphoma progression at this time No indication for retreatment of the patient's splenic marginal zone lymphoma at this time.  She does not have any chronic or ongoing problems at this has been under observation for a number of years without quired interventional  treatment.  Patient is not on any anticoagulants.  Differential diagnosis includes aortic aneurysm\gastritis\cholecystitis will proceed with CT abdomen pelvis.  Will proceed with pain control with Dilaudid.  Patient has leukocytosis at 15,000.  He has no fever.  Patient is nontoxic.  He does not show acute signs of sepsis.  Significant improved pain with Dilaudid half milligram.  Patient did require repeat dose of Dilaudid with rebound pain after ultrasound.  Ultrasound positive for cholelithiasis and early gallbladder wall thickening.  With combination of focal onset of biliary colic, gallstones, sonographic Murphy sign and leukocytosis, findings are consistent with acute cholecystitis.  Patient is n.p.o., he has had fluid resuscitation with lactated Ringer's.  Will order ceftriaxone.  Consult: Reviewed with general surgery Dr. Rosendo Gros.  We reviewed the patient's history of present illness and imaging.  At this time plan will be for transfer to [facility] preoperative holding for cholecystectomy.  findings and plan are reviewed with the patient and his wife at bedside.        Final Clinical Impression(s) / ED Diagnoses Final diagnoses:  Cholecystitis    Rx / DC Orders ED Discharge Orders     None         Charlesetta Shanks, MD 03/21/22 1253    Charlesetta Shanks, MD 03/21/22 1257

## 2022-03-21 NOTE — Discharge Instructions (Signed)
[facility] ______[facility] LAPAROSCOPIC SURGERY: POST OP INSTRUCTIONS Always review your discharge instruction sheet given to you by the facility where your surgery was performed. IF YOU HAVE DISABILITY OR FAMILY LEAVE FORMS, YOU MUST BRING THEM TO THE OFFICE FOR PROCESSING.   DO NOT GIVE THEM TO YOUR DOCTOR.  A prescription for pain medication may be given to you upon discharge.  Take your pain medication as prescribed, if needed.  If narcotic pain medicine is not needed, then you may take acetaminophen (Tylenol) or ibuprofen (Advil) as needed. Take your usually prescribed medications unless otherwise directed. If you need a refill on your pain medication, please contact your pharmacy.  They will contact our office to request authorization. Prescriptions will not be filled after 5pm or on week-ends. You should follow a light diet the first few days after arrival home, such as soup and crackers, etc.  Be sure to include lots of fluids daily. Most patients will experience some swelling and bruising in the area of the incisions.  Ice packs will help.  Swelling and bruising can take several days to resolve.  It is common to experience some constipation if taking pain medication after surgery.  Increasing fluid intake and taking a stool softener (such as Colace) will usually help or prevent this problem from occurring.  A mild laxative (Milk of Magnesia or Miralax) should be taken according to package instructions if there are no bowel movements after 48 hours. Unless discharge instructions indicate otherwise, you may remove your bandages 24-48 hours after surgery, and you may shower at that time.  You may have steri-strips (small skin tapes) in place directly over the incision.  These strips should be left on the skin for 7-10 days.  If your surgeon used skin glue on the incision, you may shower in 24 hours.  The glue will flake off over the next 2-3 weeks.  Any sutures or staples will be  removed at the office during your follow-up visit. ACTIVITIES:  You may resume regular (light) daily activities beginning the next day--such as daily self-care, walking, climbing stairs--gradually increasing activities as tolerated.  You may have sexual intercourse when it is comfortable.  Refrain from any heavy lifting or straining until approved by your doctor. You may drive when you are no longer taking prescription pain medication, you can comfortably wear a seatbelt, and you can safely maneuver your car and apply brakes. RETURN TO WORK:  __________________________________________________________ You should see your doctor in the office for a follow-up appointment approximately 2-3 weeks after your surgery.  Make sure that you call for this appointment within a day or two after you arrive home to insure a convenient appointment time. OTHER INSTRUCTIONS: __________________________________________________________________________________________________________________________ __________________________________________________________________________________________________________________________ WHEN TO CALL YOUR DOCTOR: Fever over 101.0 Inability to urinate Continued bleeding from incision. Increased pain, redness, or drainage from the incision. Increasing abdominal pain  The clinic staff is available to answer your questions during regular business hours.  Please don't hesitate to call and ask to speak to one of the nurses for clinical concerns.  If you have a medical emergency, go to the nearest emergency room or call 911.  A surgeon from [facility] is always on call at the hospital. [address], [address], [place], [place]  [address] ? [address] [address], [place],    [address] [phone] ? [phone] ? FAX [phone] Web site: [email or web address]  

## 2022-03-21 NOTE — ED Notes (Signed)
As I write this I have just called report to Juliann Pulse, Therapist, sports at [facility]. [facility] is here and in the process of loading him onto their stretcher.

## 2022-03-21 NOTE — Transfer of Care (Signed)
Immediate Anesthesia Transfer of Care Note  Patient: Kyle Sawyer  Procedure(s) Performed: LAPAROSCOPIC CHOLECYSTECTOMY (Abdomen)  Patient Location: PACU  Anesthesia Type:General  Level of Consciousness: drowsy and patient cooperative  Airway & Oxygen Therapy: Patient Spontanous Breathing  Post-op Assessment: Report given to RN and Post -op Vital signs reviewed and stable  Post vital signs: Reviewed and stable  Last Vitals:  Vitals Value Taken Time  BP 141/70 03/21/22 1645  Temp    Pulse 61 03/21/22 1649  Resp 16 03/21/22 1649  SpO2 95 % 03/21/22 1649  Vitals shown include unvalidated device data.  Last Pain:  Vitals:   03/21/22 1521  TempSrc:   PainSc: 4          Complications: No notable events documented.

## 2022-03-21 NOTE — ED Triage Notes (Signed)
Pt arrives to ED with c/o epigastric pain that started last night with pain that radiates to his back.

## 2022-03-21 NOTE — Op Note (Signed)
03/21/2022  4:23 PM  PATIENT:  Lorne Skeens  81 y.o. male  PRE-OPERATIVE DIAGNOSIS:  CHOLECYSTITIS  POST-OPERATIVE DIAGNOSIS:  acute necrotic, cholecystitis  PROCEDURE:  Procedure(s): LAPAROSCOPIC CHOLECYSTECTOMY (N/A)  SURGEON:  Surgeon(s) and Role:    Ralene Ok, MD - Primary  ANESTHESIA:   local and general  EBL:  15 mL   BLOOD ADMINISTERED:none  DRAINS: none   LOCAL MEDICATIONS USED:  BUPIVICAINE   SPECIMEN:  Source of Specimen:  gallbladder  DISPOSITION OF SPECIMEN:  PATHOLOGY  COUNTS:  YES  TOURNIQUET:  * No tourniquets in log *  DICTATION: .Dragon Dictation  EBL: <0GY   Complications: none   Counts: reported as correct x 2   Findings:acute necrotic cholecystitis  Indications for procedure: Pt is a 70M with RUQ pain and seen to have gallstones and acute cholecystitis  Details of the procedure: The patient was taken to the operating and placed in the supine position with bilateral SCDs in place. A time out was called and all facts were verified. A pneumoperitoneum was obtained via A Veress needle technique to a pressure of 22m of mercury. A 562mtrochar was then placed in the right upper quadrant under visualization, and there were no injuries to any abdominal organs. A 11 mm port was then placed in the umbilical region after infiltrating with local anesthesia under direct visualization. A second epigastric port was placed under direct visualization.   The gallbladder was identified and retracted, the peritoneum was then sharply dissected from the gallbladder and this dissection was carried down to Calot's triangle. The cystic duct was identified and dissected circumferentially and seen going into the gallbladder 360.  The cystic artery was dissected away from the surrounding tissues.   The critical angle was obtained.    2 clips were placed proximally one distally and the cystic duct transected. The cystic artery was identified and 2 clips placed  proximally and one distally and transected. We then proceeded to remove the gallbladder off the hepatic fossa with Bovie cautery. A retrieval bag was then placed in the abdomen and gallbladder placed in the bag. The hepatic fossa was then reexamined and hemostasis was achieved with Bovie cautery and was excellent at this portion of the case. 2 pieces of SNOW hemastatic agent were placed in the subhepatic fossa.  The subhepatic fossa and perihepatic fossa was then irrigated until the effluent was clear. The specimen bag and specimen were removed from the abdominal cavity.  The 11 mm trocar fascia was reapproximated with the Endo Close #1 Vicryl x2. The pneumoperitoneum was evacuated and all trochars removed under direct visulalization. The skin was then closed with 4-0 Monocryl and the skin dressed with Dermabond. The patient was awaken from general anesthesia and taken to the recovery room in stable condition.    PLAN OF CARE: Discharge to home after PACU  PATIENT DISPOSITION:  PACU - hemodynamically stable.   Delay start of Pharmacological VTE agent (>24hrs) due to surgical blood loss or risk of bleeding: not applicable

## 2022-03-21 NOTE — Anesthesia Preprocedure Evaluation (Addendum)
Anesthesia Evaluation  Patient identified by MRN, date of birth, ID band Patient awake    Reviewed: Allergy & Precautions, NPO status , Patient's Chart, lab work & pertinent test results  Airway Mallampati: II  TM Distance: >3 FB Neck ROM: Full    Dental no notable dental hx. (+) Partial Upper   Pulmonary sleep apnea , Current Smoker   Pulmonary exam normal        Cardiovascular negative cardio ROS  Rhythm:Regular Rate:Normal     Neuro/Psych negative neurological ROS  negative psych ROS   GI/Hepatic negative GI ROS, Neg liver ROS,,,  Endo/Other  negative endocrine ROS    Renal/GU negative Renal ROS  negative genitourinary   Musculoskeletal negative musculoskeletal ROS (+)    Abdominal Normal abdominal exam  (+)   Peds  Hematology negative hematology ROS (+)   Anesthesia Other Findings   Reproductive/Obstetrics                             Anesthesia Physical Anesthesia Plan  ASA: 2  Anesthesia Plan: General   Post-op Pain Management:    Induction: Intravenous  PONV Risk Score and Plan: 1 and Ondansetron, Dexamethasone and Treatment may vary due to age or medical condition  Airway Management Planned: Mask and Oral ETT  Additional Equipment: None  Intra-op Plan:   Post-operative Plan: Extubation in OR  Informed Consent: I have reviewed the patients History and Physical, chart, labs and discussed the procedure including the risks, benefits and alternatives for the proposed anesthesia with the patient or authorized representative who has indicated his/her understanding and acceptance.     Dental advisory given  Plan Discussed with: CRNA  Anesthesia Plan Comments:        Anesthesia Quick Evaluation

## 2022-03-22 ENCOUNTER — Encounter (HOSPITAL_COMMUNITY): Payer: Self-pay | Admitting: General Surgery

## 2022-03-22 NOTE — Anesthesia Postprocedure Evaluation (Signed)
Anesthesia Post Note  Patient: Kyle Sawyer  Procedure(s) Performed: LAPAROSCOPIC CHOLECYSTECTOMY (Abdomen)     Patient location during evaluation: PACU Anesthesia Type: General Level of consciousness: awake and alert Pain management: pain level controlled Vital Signs Assessment: post-procedure vital signs reviewed and stable Respiratory status: spontaneous breathing, nonlabored ventilation, respiratory function stable and patient connected to nasal cannula oxygen Cardiovascular status: blood pressure returned to baseline and stable Postop Assessment: no apparent nausea or vomiting Anesthetic complications: no   No notable events documented.  Last Vitals:  Vitals:   03/21/22 1725 03/21/22 1740  BP: 134/66 128/69  Pulse: (!) 55 (!) 48  Resp: 12 13  Temp:  36.9 C  SpO2: 95% 96%    Last Pain:  Vitals:   03/21/22 1740  TempSrc:   PainSc: 0-No pain                 Belenda Cruise P Gretel Cantu

## 2022-03-24 LAB — SURGICAL PATHOLOGY

## 2022-04-10 DIAGNOSIS — H25012 Cortical age-related cataract, left eye: Secondary | ICD-10-CM | POA: Diagnosis not present

## 2022-04-10 DIAGNOSIS — H5213 Myopia, bilateral: Secondary | ICD-10-CM | POA: Diagnosis not present

## 2022-04-10 DIAGNOSIS — H2513 Age-related nuclear cataract, bilateral: Secondary | ICD-10-CM | POA: Diagnosis not present

## 2022-05-04 DIAGNOSIS — Z85828 Personal history of other malignant neoplasm of skin: Secondary | ICD-10-CM | POA: Diagnosis not present

## 2022-05-04 DIAGNOSIS — Z85831 Personal history of malignant neoplasm of soft tissue: Secondary | ICD-10-CM | POA: Diagnosis not present

## 2022-05-04 DIAGNOSIS — L821 Other seborrheic keratosis: Secondary | ICD-10-CM | POA: Diagnosis not present

## 2022-05-04 DIAGNOSIS — D225 Melanocytic nevi of trunk: Secondary | ICD-10-CM | POA: Diagnosis not present

## 2022-05-04 DIAGNOSIS — L570 Actinic keratosis: Secondary | ICD-10-CM | POA: Diagnosis not present

## 2022-05-04 DIAGNOSIS — Z87898 Personal history of other specified conditions: Secondary | ICD-10-CM | POA: Diagnosis not present

## 2022-05-04 DIAGNOSIS — L814 Other melanin hyperpigmentation: Secondary | ICD-10-CM | POA: Diagnosis not present

## 2022-05-04 DIAGNOSIS — L578 Other skin changes due to chronic exposure to nonionizing radiation: Secondary | ICD-10-CM | POA: Diagnosis not present

## 2022-05-13 ENCOUNTER — Inpatient Hospital Stay: Payer: PPO | Admitting: Hematology

## 2022-05-13 ENCOUNTER — Inpatient Hospital Stay: Payer: PPO

## 2022-05-28 ENCOUNTER — Other Ambulatory Visit: Payer: Self-pay

## 2022-05-28 DIAGNOSIS — C8582 Other specified types of non-Hodgkin lymphoma, intrathoracic lymph nodes: Secondary | ICD-10-CM

## 2022-06-01 ENCOUNTER — Inpatient Hospital Stay: Payer: PPO | Attending: Hematology

## 2022-06-01 ENCOUNTER — Inpatient Hospital Stay (HOSPITAL_BASED_OUTPATIENT_CLINIC_OR_DEPARTMENT_OTHER): Payer: PPO | Admitting: Hematology

## 2022-06-01 VITALS — BP 113/65 | HR 56 | Temp 98.9°F | Resp 16 | Wt 194.6 lb

## 2022-06-01 DIAGNOSIS — C8582 Other specified types of non-Hodgkin lymphoma, intrathoracic lymph nodes: Secondary | ICD-10-CM | POA: Diagnosis not present

## 2022-06-01 LAB — CMP (CANCER CENTER ONLY)
ALT: 15 U/L (ref 0–44)
AST: 22 U/L (ref 15–41)
Albumin: 4.3 g/dL (ref 3.5–5.0)
Alkaline Phosphatase: 60 U/L (ref 38–126)
Anion gap: 5 (ref 5–15)
BUN: 17 mg/dL (ref 8–23)
CO2: 28 mmol/L (ref 22–32)
Calcium: 8.8 mg/dL — ABNORMAL LOW (ref 8.9–10.3)
Chloride: 106 mmol/L (ref 98–111)
Creatinine: 1.19 mg/dL (ref 0.61–1.24)
GFR, Estimated: >60 mL/min (ref >60)
Glucose, Bld: 94 mg/dL (ref 70–99)
Potassium: 4 mmol/L (ref 3.5–5.1)
Sodium: 139 mmol/L (ref 135–145)
Total Bilirubin: 0.7 mg/dL (ref 0.3–1.2)
Total Protein: 6.1 g/dL — ABNORMAL LOW (ref 6.5–8.1)

## 2022-06-01 LAB — CBC WITH DIFFERENTIAL (CANCER CENTER ONLY)
Abs Immature Granulocytes: 0.03 10*3/uL (ref 0.00–0.07)
Basophils Absolute: 0.1 10*3/uL (ref 0.0–0.1)
Basophils Relative: 1 %
Eosinophils Absolute: 0.5 10*3/uL (ref 0.0–0.5)
Eosinophils Relative: 5 %
HCT: 39.7 % (ref 39.0–52.0)
Hemoglobin: 13.7 g/dL (ref 13.0–17.0)
Immature Granulocytes: 0 %
Lymphocytes Relative: 50 %
Lymphs Abs: 5.3 10*3/uL — ABNORMAL HIGH (ref 0.7–4.0)
MCH: 30.6 pg (ref 26.0–34.0)
MCHC: 34.5 g/dL (ref 30.0–36.0)
MCV: 88.8 fL (ref 80.0–100.0)
Monocytes Absolute: 0.6 10*3/uL (ref 0.1–1.0)
Monocytes Relative: 6 %
Neutro Abs: 4 10*3/uL (ref 1.7–7.7)
Neutrophils Relative %: 38 %
Platelet Count: 153 10*3/uL (ref 150–400)
RBC: 4.47 MIL/uL (ref 4.22–5.81)
RDW: 13.9 % (ref 11.5–15.5)
Smear Review: NORMAL
WBC Count: 10.5 10*3/uL (ref 4.0–10.5)
nRBC: 0 % (ref 0.0–0.2)

## 2022-06-01 LAB — LACTATE DEHYDROGENASE: LDH: 150 U/L (ref 98–192)

## 2022-06-01 NOTE — Progress Notes (Signed)
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 He is accompanied today by his wife. The pt reports that he is doing well overall.   The pt had a blood flow cytometry on 09/11/16 which revealed a monoclonal B cell population, positive for CD20. A 09/11/16 FISH - CLL Panel was unremarkable save for 2.5% cells showing loss of 13q34 and 2% cells showing loss of p53. The pt has thus far been followed with repeat CT C/A/P every 3 months and last had a CT on 10/04/17 as noted below.   The pt reports that he has not developed any new concerns or symptoms since his last visit with Dr Lebron Conners in April.   The pt denies having ever received a blood transfusion nor has any tattoos.   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   He notes that he had the old shingles vaccine but had a shingles outbreak in December 2018.   Of note prior to the patient's visit today, pt has had CT C/A/P completed on 10/04/17 with results revealing Persistent splenomegaly. 2. Mediastinal and right hilar lymphadenopathy has regressed. 3. No new sites of disease are noted elsewhere in the chest, abdomen or pelvis. 4. Subtle inflammatory changes in the fat of the sigmoid mesocolon. This is of uncertain etiology and significance. Typically this is seen in the setting of diverticular disease, however, no adjacent colonic diverticulae are noted. Attention on routine imaging follow-up is recommended to ensure resolution. 5. Aortic atherosclerosis, in addition to left main and 3 vessel coronary  artery disease. Assessment for potential risk factor modification, dietary therapy or pharmacologic therapy may be warranted, if clinically indicated. 6. There are calcifications of the aortic valve. Echocardiographic correlation for evaluation of potential valvular dysfunction may be warranted if clinically indicated. 7. Additional incidental findings.   The pt also had a CT Soft Tissue Neck on 10/04/17 which revealed No evidence of cervical lymphadenopathy.   Most recent lab results (10/04/17) of CBC w/diff, CMP  is as follows: all values are WNL except for WBC at 11.7k, Lymphs abs at 6.4. LDH 10/04/17 is WNL at 187  On review of systems, pt reports stable energy levels, and denies fevers, chills, night sweats, unexpected weight loss, abdominal pains, chest pain, shortness of breath, leg swelling, and any other symptoms.   Interval History:   Kyle Sawyer is here for continued evaluation and management of his splenic marginal zone lymphoma.   Patient was last seen by me on 11/11/2021 and he was doing well overall.   Patient reports he has been doing well overall without any new medical concerns since our last visit. He notes he had emergency gall bladder surgery on March 11, 2022. He notes the surgery went well without any side effects.   He complains of back pain when he wakes up in the morning. He denies fever, chills, night sweats, unexpected weight loss, abdominal pain, back pain, or leg swelling. He has lost weight, but it was not unexpected.   Patient notes he regularly sees his dermatologist every 6 months.  Patient reports he started taking lovastatin since our last visit.   He has received influenza vaccine, COVID-19 Booster, and RSV vaccine. He has received his pneumonia vaccine, but denies shingrex vaccine.     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    SURGICAL HISTORY: Past Surgical History:  Procedure Laterality Date   CHOLECYSTECTOMY N/A 03/21/2022   Procedure: LAPAROSCOPIC CHOLECYSTECTOMY;  Surgeon: Ralene Ok, MD;  Location: [facility];  Service: General;  Laterality: N/A;   [place] 2007   sarcoma     1995   SKIN SURGERY     various unspecified    SOCIAL HISTORY: Social History   Socioeconomic History   Marital status: Married    Spouse name: Not on file   Number of children: Not on file   Years of education: Not on file   Highest education level: Not on file  Occupational History   Occupation: Retired    Fish farm manager: [employer]  Tobacco Use   Smoking status: Former    Types: Pipe    Quit date: 10/04/2020    Years since quitting: 1.6   Smokeless tobacco: Never  Vaping Use   Vaping Use: Never used  Substance and Sexual Activity   Alcohol use: No   Drug use: No   Sexual activity: Not on file  Other Topics Concern   Not on file  Social History Narrative   Not on file   Social Determinants of Health   Financial Resource Strain: Not on file  Food Insecurity: Not on file  Transportation Needs: Not on file  Physical Activity: Not on file  Stress: Not on file  Social Connections: Not on file  Intimate Partner Violence: Not on file    FAMILY HISTORY: No family history on file.  ALLERGIES:  is allergic to oseltamivir.  MEDICATIONS:  Current Outpatient Medications  Medication Sig Dispense Refill   Cetirizine HCl (ZYRTEC PO) Take by mouth.     FENOFIBRATE PO Take 45 mg by mouth daily.     traMADol (ULTRAM) 50 MG tablet Take 1 tablet (50 mg total) by mouth every 6 (six) hours as  needed. 20 tablet 0   No current facility-administered medications for this visit.    REVIEW OF SYSTEMS:   10 Point review of Systems was done is negative except as noted above.  PHYSICAL EXAMINATION: ECOG FS:2 - Symptomatic, <50% confined to bed  Vitals:   06/01/22 0925  BP: 113/65  Pulse: (!) 56  Resp: 16  Temp: 98.9 F (37.2 C)  SpO2: 99%     Wt Readings from Last 3 Encounters:  06/01/22 194 lb 9.6 oz (88.3 kg)  03/21/22 193 lb (87.5 kg)  11/11/21 195 lb 4.8 oz (88.6 kg)   Body mass index is 27.92 kg/m.   NAD GENERAL:alert, in no acute distress and comfortable SKIN: no acute rashes, no significant lesions EYES: conjunctiva are pink and non-injected, sclera anicteric NECK: supple, no JVD LYMPH:  no palpable  lymphadenopathy in the cervical, axillary or inguinal regions LUNGS: clear to auscultation b/l with normal respiratory effort HEART: regular rate & rhythm ABDOMEN:  normoactive bowel sounds , non tender, not distended. Extremity: no pedal edema PSYCH: alert & oriented x 3 with fluent speech NEURO: no focal motor/sensory deficits  LABORATORY DATA:  I have reviewed the data as listed  .    Latest Ref Rng & Units 06/01/2022    8:52 AM 03/21/2022    7:53 AM 11/11/2021    8:40 AM  CBC  WBC 4.0 - 10.5 K/uL 10.5  15.3  12.3   Hemoglobin 13.0 - 17.0 g/dL 13.7  13.5  13.5   Hematocrit 39.0 - 52.0 % 39.7  40.2  39.3   Platelets 150 - 400 K/uL 153  160  181    CBC    Component Value Date/Time   WBC 10.5 06/01/2022 0852   WBC 15.3 (H) 03/21/2022 0753   RBC 4.47 06/01/2022 0852   HGB 13.7 06/01/2022 0852   HGB 13.9 04/07/2017 0939   HCT 39.7 06/01/2022 0852   HCT 41.8 04/07/2017 0939   PLT 153 06/01/2022 0852   PLT 149 04/07/2017 0939   MCV 88.8 06/01/2022 0852   MCV 91.7 04/07/2017 0939   MCH 30.6 06/01/2022 0852   MCHC 34.5 06/01/2022 0852   RDW 13.9 06/01/2022 0852   RDW 15.0 (H) 04/07/2017 0939   LYMPHSABS 5.3 (H) 06/01/2022 0852   LYMPHSABS 6.4  (H) 04/07/2017 0939   MONOABS 0.6 06/01/2022 0852   MONOABS 0.6 04/07/2017 0939   EOSABS 0.5 06/01/2022 0852   EOSABS 0.2 04/07/2017 0939   BASOSABS 0.1 06/01/2022 0852   BASOSABS 0.1 04/07/2017 0939     .    Latest Ref Rng & Units 06/01/2022    8:52 AM 03/21/2022    7:53 AM 11/11/2021    8:40 AM  CMP  Glucose 70 - 99 mg/dL 94  151  93   BUN 8 - 23 mg/dL '17  18  14   '$ Creatinine 0.61 - 1.24 mg/dL 1.19  1.09  1.12   Sodium 135 - 145 mmol/L 139  140  138   Potassium 3.5 - 5.1 mmol/L 4.0  3.9  4.3   Chloride 98 - 111 mmol/L 106  104  107   CO2 22 - 32 mmol/L '28  27  25   '$ Calcium 8.9 - 10.3 mg/dL 8.8  9.8  8.9   Total Protein 6.5 - 8.1 g/dL 6.1  6.6  6.5   Total Bilirubin 0.3 - 1.2 mg/dL 0.7  0.7  0.5   Alkaline Phos 38 - 126 U/L 60  52  61   AST 15 - 41 U/L '22  17  19   '$ ALT 0 - 44 U/L '15  13  11    '$ . Lab Results  Component Value Date   LDH 150 06/01/2022    09/11/16 Molecular Pathology:   09/11/16 Peripheral blood flow cytometry:      RADIOGRAPHIC STUDIES: I have personally reviewed the radiological images as listed and agreed with the findings in the report. No results found.  ASSESSMENT & PLAN:   82 y.o. male with  1. 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  Mediastinal and right hilar lymphadenopathy has regressed. No new sites of disease are noted elsewhere in the chest, abdomen or pelvis.  10/04/17 CT Neck did not reveal any lymphadenopathy   Patient's spleen was described as normal in appearance on the 09/17/16 CT Abdomen/Pelvis. It has now grown to 13.5x7.7x16.3cm as of 10/04/17 CT A/P(estimated at 857m)  05/03/2018 CT C/A/P which revealed Single mildly enlarged right hilar lymph node and moderate splenomegaly may represent residual lymphoma. A right infrahilar lymph node which was previously enlarged is no longer enlarged. 2. Other  imaging findings of potential clinical significance: Aortic Atherosclerosis. Coronary atherosclerosis. 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 Mild prostatomegaly. Lumbar impingement at L3-4 and L4-5. Right humeral lucency is likely an intraosseous ganglion as shown on prior MRI. Spleen volume wise appears smaller.  PLAN: -Discussed lab results from today, 06/01/2022, with the patient. CBC and CMP is stable. LDH is stable at 150.  -Recommended Shignrex vaccine.  -Patient has no lab or clinical evidence of symptomatic or significant lymphoma progression at this time -No indication for retreatment of the patient's splenic marginal zone lymphoma at this time -Patient will get lab work at his PCP around October.   FOLLOW-UP: RTC with PCP with labs in 6 months RTC with Dr KIrene Limbowith labs in 12 months  The total time spent in the appointment was 20 minutes* .  All of the patient's questions were answered with apparent satisfaction. The patient knows to call the clinic with any problems, questions or concerns.   GSullivan LoneMD MS AAHIVMS SChrist[facility] St [facility] CountyC[facility] LPHematology/Oncology Physician [facility] .*Total Encounter Time as defined by the Centers for Medicare and Medicaid Services includes, in addition to the face-to-face time of a patient visit (documented in the note above) non-face-to-face time: obtaining and reviewing outside history, ordering and reviewing medications, tests or procedures, care coordination (communications with other health care professionals or caregivers) and documentation in the medical record.   I, PCleda Mccreedy am acting as a sEducation administratorfor GSullivan Lone MD. .I have reviewed the above documentation for accuracy and completeness, and I agree with the above. .Brunetta GeneraMD

## 2022-11-04 DIAGNOSIS — Z85831 Personal history of malignant neoplasm of soft tissue: Secondary | ICD-10-CM | POA: Diagnosis not present

## 2022-11-04 DIAGNOSIS — L570 Actinic keratosis: Secondary | ICD-10-CM | POA: Diagnosis not present

## 2022-11-04 DIAGNOSIS — D225 Melanocytic nevi of trunk: Secondary | ICD-10-CM | POA: Diagnosis not present

## 2022-11-04 DIAGNOSIS — Z87898 Personal history of other specified conditions: Secondary | ICD-10-CM | POA: Diagnosis not present

## 2022-11-04 DIAGNOSIS — Z85828 Personal history of other malignant neoplasm of skin: Secondary | ICD-10-CM | POA: Diagnosis not present

## 2022-11-04 DIAGNOSIS — L821 Other seborrheic keratosis: Secondary | ICD-10-CM | POA: Diagnosis not present

## 2022-11-04 DIAGNOSIS — L578 Other skin changes due to chronic exposure to nonionizing radiation: Secondary | ICD-10-CM | POA: Diagnosis not present

## 2022-11-04 DIAGNOSIS — L814 Other melanin hyperpigmentation: Secondary | ICD-10-CM | POA: Diagnosis not present

## 2022-12-16 ENCOUNTER — Other Ambulatory Visit (HOSPITAL_COMMUNITY): Payer: Self-pay | Admitting: Internal Medicine

## 2022-12-16 ENCOUNTER — Ambulatory Visit (HOSPITAL_COMMUNITY)
Admission: RE | Admit: 2022-12-16 | Discharge: 2022-12-16 | Disposition: A | Discharge status: Home / Self Care | Payer: PPO | Source: Ambulatory Visit | Attending: Internal Medicine | Admitting: Internal Medicine

## 2022-12-16 DIAGNOSIS — Z1331 Encounter for screening for depression: Secondary | ICD-10-CM | POA: Diagnosis not present

## 2022-12-16 DIAGNOSIS — I700 Atherosclerosis of aorta: Secondary | ICD-10-CM | POA: Diagnosis not present

## 2022-12-16 DIAGNOSIS — R600 Localized edema: Secondary | ICD-10-CM | POA: Diagnosis not present

## 2022-12-16 DIAGNOSIS — D649 Anemia, unspecified: Secondary | ICD-10-CM | POA: Diagnosis not present

## 2022-12-16 DIAGNOSIS — Z79899 Other long term (current) drug therapy: Secondary | ICD-10-CM | POA: Diagnosis not present

## 2022-12-16 DIAGNOSIS — R946 Abnormal results of thyroid function studies: Secondary | ICD-10-CM | POA: Diagnosis not present

## 2022-12-16 DIAGNOSIS — R251 Tremor, unspecified: Secondary | ICD-10-CM | POA: Diagnosis not present

## 2022-12-16 DIAGNOSIS — C8580 Other specified types of non-Hodgkin lymphoma, unspecified site: Secondary | ICD-10-CM | POA: Diagnosis not present

## 2022-12-16 DIAGNOSIS — M79604 Pain in right leg: Secondary | ICD-10-CM | POA: Diagnosis not present

## 2022-12-16 DIAGNOSIS — Z23 Encounter for immunization: Secondary | ICD-10-CM | POA: Diagnosis not present

## 2022-12-16 DIAGNOSIS — R609 Edema, unspecified: Secondary | ICD-10-CM | POA: Insufficient documentation

## 2022-12-16 DIAGNOSIS — Z0000 Encounter for general adult medical examination without abnormal findings: Secondary | ICD-10-CM | POA: Diagnosis not present

## 2022-12-16 DIAGNOSIS — Z86010 Personal history of colonic polyps: Secondary | ICD-10-CM | POA: Diagnosis not present

## 2022-12-16 DIAGNOSIS — D696 Thrombocytopenia, unspecified: Secondary | ICD-10-CM | POA: Diagnosis not present

## 2022-12-16 DIAGNOSIS — M79605 Pain in left leg: Secondary | ICD-10-CM | POA: Diagnosis not present

## 2022-12-16 DIAGNOSIS — G629 Polyneuropathy, unspecified: Secondary | ICD-10-CM | POA: Diagnosis not present

## 2022-12-16 DIAGNOSIS — E782 Mixed hyperlipidemia: Secondary | ICD-10-CM | POA: Diagnosis not present

## 2022-12-16 DIAGNOSIS — R2689 Other abnormalities of gait and mobility: Secondary | ICD-10-CM | POA: Diagnosis not present

## 2022-12-16 NOTE — Progress Notes (Signed)
Lower extremity venous duplex completed. Please see CV Procedures for preliminary results.  Shona Simpson, RVT 12/16/22 4:24 PM

## 2022-12-22 ENCOUNTER — Encounter: Payer: Self-pay | Admitting: Internal Medicine

## 2022-12-30 DIAGNOSIS — R946 Abnormal results of thyroid function studies: Secondary | ICD-10-CM | POA: Diagnosis not present

## 2023-02-10 DIAGNOSIS — R600 Localized edema: Secondary | ICD-10-CM | POA: Diagnosis not present

## 2023-02-10 DIAGNOSIS — C8590 Non-Hodgkin lymphoma, unspecified, unspecified site: Secondary | ICD-10-CM | POA: Diagnosis not present

## 2023-02-10 DIAGNOSIS — E782 Mixed hyperlipidemia: Secondary | ICD-10-CM | POA: Diagnosis not present

## 2023-02-10 DIAGNOSIS — R2689 Other abnormalities of gait and mobility: Secondary | ICD-10-CM | POA: Diagnosis not present

## 2023-02-10 DIAGNOSIS — I700 Atherosclerosis of aorta: Secondary | ICD-10-CM | POA: Diagnosis not present

## 2023-02-10 DIAGNOSIS — E039 Hypothyroidism, unspecified: Secondary | ICD-10-CM | POA: Diagnosis not present

## 2023-02-10 DIAGNOSIS — Z23 Encounter for immunization: Secondary | ICD-10-CM | POA: Diagnosis not present

## 2023-02-10 DIAGNOSIS — R251 Tremor, unspecified: Secondary | ICD-10-CM | POA: Diagnosis not present

## 2023-02-10 DIAGNOSIS — G629 Polyneuropathy, unspecified: Secondary | ICD-10-CM | POA: Diagnosis not present

## 2023-02-10 DIAGNOSIS — D649 Anemia, unspecified: Secondary | ICD-10-CM | POA: Diagnosis not present

## 2023-02-11 DIAGNOSIS — D649 Anemia, unspecified: Secondary | ICD-10-CM | POA: Diagnosis not present

## 2023-02-11 DIAGNOSIS — E782 Mixed hyperlipidemia: Secondary | ICD-10-CM | POA: Diagnosis not present

## 2023-02-11 DIAGNOSIS — E039 Hypothyroidism, unspecified: Secondary | ICD-10-CM | POA: Diagnosis not present

## 2023-04-02 DIAGNOSIS — Z79899 Other long term (current) drug therapy: Secondary | ICD-10-CM | POA: Diagnosis not present

## 2023-06-01 ENCOUNTER — Other Ambulatory Visit: Payer: Self-pay

## 2023-06-01 DIAGNOSIS — C8582 Other specified types of non-Hodgkin lymphoma, intrathoracic lymph nodes: Secondary | ICD-10-CM

## 2023-06-02 ENCOUNTER — Inpatient Hospital Stay: Payer: PPO | Admitting: Hematology

## 2023-06-02 ENCOUNTER — Inpatient Hospital Stay: Payer: PPO | Attending: Hematology

## 2023-06-02 VITALS — BP 119/70 | HR 48 | Temp 97.9°F | Resp 17 | Wt 189.6 lb

## 2023-06-02 DIAGNOSIS — C8582 Other specified types of non-Hodgkin lymphoma, intrathoracic lymph nodes: Secondary | ICD-10-CM

## 2023-06-02 DIAGNOSIS — Z8572 Personal history of non-Hodgkin lymphomas: Secondary | ICD-10-CM | POA: Insufficient documentation

## 2023-06-02 LAB — CMP (CANCER CENTER ONLY)
ALT: 16 U/L (ref 0–44)
AST: 22 U/L (ref 15–41)
Albumin: 4.3 g/dL (ref 3.5–5.0)
Alkaline Phosphatase: 67 U/L (ref 38–126)
Anion gap: 3 — ABNORMAL LOW (ref 5–15)
BUN: 15 mg/dL (ref 8–23)
CO2: 29 mmol/L (ref 22–32)
Calcium: 8.7 mg/dL — ABNORMAL LOW (ref 8.9–10.3)
Chloride: 108 mmol/L (ref 98–111)
Creatinine: 1.18 mg/dL (ref 0.61–1.24)
GFR, Estimated: >60 mL/min (ref >60)
Glucose, Bld: 98 mg/dL (ref 70–99)
Potassium: 4.4 mmol/L (ref 3.5–5.1)
Sodium: 140 mmol/L (ref 135–145)
Total Bilirubin: 0.6 mg/dL (ref 0.0–1.2)
Total Protein: 6.2 g/dL — ABNORMAL LOW (ref 6.5–8.1)

## 2023-06-02 LAB — CBC WITH DIFFERENTIAL (CANCER CENTER ONLY)
Abs Immature Granulocytes: 0.02 10*3/uL (ref 0.00–0.07)
Basophils Absolute: 0 10*3/uL (ref 0.0–0.1)
Basophils Relative: 0 %
Eosinophils Absolute: 0.2 10*3/uL (ref 0.0–0.5)
Eosinophils Relative: 2 %
HCT: 41.4 % (ref 39.0–52.0)
Hemoglobin: 13.9 g/dL (ref 13.0–17.0)
Immature Granulocytes: 0 %
Lymphocytes Relative: 62 %
Lymphs Abs: 6.6 10*3/uL — ABNORMAL HIGH (ref 0.7–4.0)
MCH: 29.7 pg (ref 26.0–34.0)
MCHC: 33.6 g/dL (ref 30.0–36.0)
MCV: 88.5 fL (ref 80.0–100.0)
Monocytes Absolute: 0.5 10*3/uL (ref 0.1–1.0)
Monocytes Relative: 5 %
Neutro Abs: 3.4 10*3/uL (ref 1.7–7.7)
Neutrophils Relative %: 31 %
Platelet Count: 151 10*3/uL (ref 150–400)
RBC: 4.68 MIL/uL (ref 4.22–5.81)
RDW: 13.8 % (ref 11.5–15.5)
WBC Count: 10.8 10*3/uL — ABNORMAL HIGH (ref 4.0–10.5)
nRBC: 0 % (ref 0.0–0.2)

## 2023-06-02 LAB — LACTATE DEHYDROGENASE: LDH: 157 U/L (ref 98–192)

## 2023-06-02 NOTE — Progress Notes (Signed)
 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 He is accompanied today by his wife. The pt reports that he is doing well overall.   The pt had a blood flow cytometry on 09/11/16 which revealed a monoclonal B cell population, positive for CD20. A 09/11/16 FISH - CLL Panel was unremarkable save for 2.5% cells showing loss of 13q34 and 2% cells showing loss of p53. The pt has thus far been followed with repeat CT C/A/P every 3 months and last had a CT on 10/04/17 as noted below.   The pt reports that he has not developed any new concerns or symptoms since his last visit with Dr Gweneth Dimitri in April.   The pt denies having ever received a blood transfusion nor has any tattoos.   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   He notes that he had the old shingles vaccine but had a shingles outbreak in December 2018.   Of note prior to the patient's visit today, pt has had CT C/A/P completed on 10/04/17 with results revealing Persistent splenomegaly. 2. Mediastinal and right hilar lymphadenopathy has regressed. 3. No new sites of disease are noted elsewhere in the chest, abdomen or pelvis. 4. Subtle inflammatory changes in the fat of the sigmoid mesocolon. This is of uncertain etiology and significance. Typically this is seen in the setting of diverticular disease, however, no adjacent colonic diverticulae are noted. Attention on routine imaging follow-up is recommended to ensure resolution. 5. Aortic atherosclerosis, in addition to left main and 3 vessel coronary  artery disease. Assessment for potential risk factor modification, dietary therapy or pharmacologic therapy may be warranted, if clinically indicated. 6. There are calcifications of the aortic valve. Echocardiographic correlation for evaluation of potential valvular dysfunction may be warranted if clinically indicated. 7. Additional incidental findings.   The pt also had a CT Soft Tissue Neck on 10/04/17 which revealed No evidence of cervical lymphadenopathy.   Most recent lab results (10/04/17) of CBC w/diff, CMP  is as follows: all values are WNL except for WBC at 11.7k, Lymphs abs at 6.4. LDH 10/04/17 is WNL at 187  On review of systems, pt reports stable energy levels, and denies fevers, chills, night sweats, unexpected weight loss, abdominal pains, chest pain, shortness of breath, leg swelling, and any other symptoms.   Interval History:   Kyle Sawyer is here for continued evaluation and management of his splenic marginal zone lymphoma.   Patient was last seen by me on 06/01/2022 and reported having emergency gall bladder surgery on March 11, 2022, which he recovered well from. At the time of the visit, he complained of back pain in the mornings.  Today, he reports that he has been doing well over the last year. He reports that he was seen by his PCP in October 2024 and was diagnosed with hypothyroidism. Patient did not have any symptomatic issues with hyperthyroidism. He has been taking Levothyroxine 100 mcg since October 2024 and denies any changes in energy levels.   Thyroid function testing from 12/16/2022 shows TSH significantly elevated  at 58.24 UIU/mL. Patient reports that his TSH levels have improved with thyroid replacement.   He notes that his previous PCP, Dr. Pete Glatter had retired and his current PCP is Dr. Orson Aloe. He reports that he will see his PCP next in April 2025.   Patient has lost weight since starting Levothyroxine 100 mcg. Patient denies any fever, chills, new  lumps/lumps, abdominal pain/distention, back pain, infection issues, or leg swelling at this time. Patient has normal energy levels at this time  He reports that he sometimes endorses night sweats, though he notes that his wife does as well.   Patient reports that he is no longer on albuterol. He takes Cetirizine as needed.   Patient is currently off of Lovastatin, but notes that he did previously endorse leg swelling with statin medication. His previous leg swelling has resolved.   He reports that he will be traveling to [place] next week.   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    SURGICAL HISTORY: Past Surgical History:  Procedure Laterality Date   CHOLECYSTECTOMY N/A 03/21/2022   Procedure: LAPAROSCOPIC CHOLECYSTECTOMY;  Surgeon: Axel Filler, MD;  Location: [facility] OR;  Service: General;  Laterality: N/A;   HEMORRHOID SURGERY     HERNIA REPAIR     LIH 2007   sarcoma     1995   SKIN SURGERY     various unspecified    SOCIAL HISTORY: Social History   Socioeconomic History   Marital status: Married    Spouse name: Not on file   Number of children: Not on file   Years of education: Not on file   Highest education level: Not on file  Occupational History   Occupation: Retired    Associate Professor: [employer]  Tobacco Use   Smoking status: Former    Types: Pipe    Quit date: 10/04/2020    Years since quitting: 2.6   Smokeless tobacco: Never  Vaping Use   Vaping status: Never Used  Substance and Sexual Activity   Alcohol use: No   Drug use: No   Sexual activity: Not  on file  Other Topics Concern   Not on file  Social History Narrative   Not on file   Social Drivers of Health   Financial Resource Strain: Not on file  Food Insecurity: Not on file  Transportation Needs: Not on file  Physical Activity: Not on file  Stress: Not on file  Social Connections: Not on file  Intimate Partner Violence: Not on file    FAMILY HISTORY: No family history on file.  ALLERGIES:  is allergic to oseltamivir.  MEDICATIONS:  Current Outpatient Medications  Medication Sig Dispense Refill   albuterol (VENTOLIN HFA) 108 (90 Base) MCG/ACT inhaler 1-2 puffs every 6 (six) hours as needed.     Cetirizine HCl (ZYRTEC PO) Take by mouth.     FENOFIBRATE PO Take 45 mg by mouth daily.     lovastatin (MEVACOR) 10 MG tablet Take 10 mg by mouth at bedtime.     No current facility-administered  medications for this visit.    REVIEW OF SYSTEMS:    10 Point review of Systems was done is negative except as noted above.   PHYSICAL EXAMINATION: ECOG FS:2 - Symptomatic, <50% confined to bed  Vitals:   06/02/23 0932  BP: 119/70  Pulse: (!) 48  Resp: 17  Temp: 97.9 F (36.6 C)  SpO2: 100%      Wt Readings from Last 3 Encounters:  06/02/23 189 lb 9.6 oz (86 kg)  06/01/22 194 lb 9.6 oz (88.3 kg)  03/21/22 193 lb (87.5 kg)   Body mass index is 27.2 kg/m.     GENERAL:alert, in no acute distress and comfortable SKIN: no acute rashes, no significant lesions EYES: conjunctiva are pink and non-injected, sclera anicteric OROPHARYNX: MMM, no exudates, no oropharyngeal erythema or ulceration NECK: supple, no JVD LYMPH:  no palpable lymphadenopathy in the cervical, axillary or inguinal regions LUNGS: clear to auscultation b/l with normal respiratory effort HEART: regular rate & rhythm ABDOMEN:  normoactive bowel sounds , non tender, not distended. No palpable splenomegaly Extremity: no pedal edema PSYCH: alert & oriented x 3 with fluent speech NEURO: no focal  motor/sensory deficits   LABORATORY DATA:  I have reviewed the data as listed  .    Latest Ref Rng & Units 06/01/2022    8:52 AM 03/21/2022    7:53 AM 11/11/2021    8:40 AM  CBC  WBC 4.0 - 10.5 K/uL 10.5  15.3  12.3   Hemoglobin 13.0 - 17.0 g/dL 60.4  54.0  98.1   Hematocrit 39.0 - 52.0 % 39.7  40.2  39.3   Platelets 150 - 400 K/uL 153  160  181    CBC    Component Value Date/Time   WBC 10.5 06/01/2022 0852   WBC 15.3 (H) 03/21/2022 0753   RBC 4.47 06/01/2022 0852   HGB 13.7 06/01/2022 0852   HGB 13.9 04/07/2017 0939   HCT 39.7 06/01/2022 0852   HCT 41.8 04/07/2017 0939   PLT 153 06/01/2022 0852   PLT 149 04/07/2017 0939   MCV 88.8 06/01/2022 0852   MCV 91.7 04/07/2017 0939   MCH 30.6 06/01/2022 0852   MCHC 34.5 06/01/2022 0852   RDW 13.9 06/01/2022 0852   RDW 15.0 (H) 04/07/2017 0939   LYMPHSABS 5.3 (H) 06/01/2022 0852   LYMPHSABS 6.4 (H) 04/07/2017 0939   MONOABS 0.6 06/01/2022 0852   MONOABS 0.6 04/07/2017 0939   EOSABS 0.5 06/01/2022 0852   EOSABS 0.2 04/07/2017 0939   BASOSABS 0.1 06/01/2022 0852   BASOSABS 0.1 04/07/2017 0939     .    Latest Ref Rng & Units 06/01/2022    8:52 AM 03/21/2022    7:53 AM 11/11/2021    8:40 AM  CMP  Glucose 70 - 99 mg/dL 94  191  93   BUN 8 - 23 mg/dL 17  18  14    Creatinine 0.61 - 1.24 mg/dL 4.78  2.95  6.21   Sodium 135 - 145 mmol/L 139  140  138   Potassium 3.5 - 5.1 mmol/L 4.0  3.9  4.3   Chloride 98 - 111 mmol/L 106  104  107   CO2 22 - 32 mmol/L 28  27  25    Calcium 8.9 - 10.3 mg/dL 8.8  9.8  8.9   Total Protein 6.5 - 8.1 g/dL 6.1  6.6  6.5   Total Bilirubin 0.3 - 1.2 mg/dL 0.7  0.7  0.5   Alkaline Phos  38 - 126 U/L 60  52  61   AST 15 - 41 U/L 22  17  19    ALT 0 - 44 U/L 15  13  11     . Lab Results  Component Value Date   LDH 150 06/01/2022    09/11/16 Molecular Pathology:   09/11/16 Peripheral blood flow cytometry:      RADIOGRAPHIC STUDIES: I have personally reviewed the radiological images as listed  and agreed with the findings in the report. No results found.  ASSESSMENT & PLAN:   83 y.o. male with  1. 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  Mediastinal and right hilar lymphadenopathy has regressed. No new sites of disease are noted elsewhere in the chest, abdomen or pelvis.  10/04/17 CT Neck did not reveal any lymphadenopathy   Patient's spleen was described as normal in appearance on the 09/17/16 CT Abdomen/Pelvis. It has now grown to 13.5x7.7x16.3cm as of 10/04/17 CT A/P(estimated at )  05/03/2018 CT C/A/P which revealed Single mildly enlarged right hilar lymph node and moderate splenomegaly may represent residual lymphoma. A right infrahilar lymph node which was previously enlarged is no longer enlarged. 2. Other imaging findings of potential clinical significance: Aortic Atherosclerosis. Coronary atherosclerosis. 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 Mild prostatomegaly. Lumbar impingement at L3-4 and L4-5. Right humeral lucency is likely an intraosseous ganglion as shown on prior MRI. Spleen volume wise appears smaller.  PLAN:  -Discussed lab results on 06/02/23 in detail with patient. CBC showed WBC of 10.8K, hemoglobin of 13.9, and platelets of 151K. -his lymphocytes are mildly elevated and fairly stable at 6.6K, which is not significantly concerning. His other blood counts are normal.  -CMP normal -Did not feel enlarged lymph nodes or enlarged speen during physical examination -Patient has no lab or clinical evidence of symptomatic or significant lymphoma progression at this time -No indication for treatment for patient's splenic marginal zone lymphoma at this time -educated patient that hypothyroidism can cause elevated cholesterol levels.  -continue to follow with PCP to continue to monitor thyroid function -answered all of  patient's questions in detail -patient shall return to clinic with labs in 1 year  FOLLOW-UP: RTC with Dr Candise Che with labs in 12 months  The total time spent in the appointment was 20 minutes* .  All of the patient's questions were answered with apparent satisfaction. The patient knows to call the clinic with any problems, questions or concerns.   Wyvonnia Lora MD MS AAHIVMS [facility] Hematology/Oncology Physician [facility]  .*Total Encounter Time as defined by the Centers for Medicare and Medicaid Services includes, in addition to the face-to-face time of a patient visit (documented in the note above) non-face-to-face time: obtaining and reviewing outside history, ordering and reviewing medications, tests or procedures, care coordination (communications with other health care professionals or caregivers) and documentation in the medical record.    I,Mitra Faeizi,acting as a Neurosurgeon for Wyvonnia Lora, MD.,have documented all relevant documentation on the behalf of Wyvonnia Lora, MD,as directed by  Wyvonnia Lora, MD while in the presence of Wyvonnia Lora, MD.  .I have reviewed the above documentation for accuracy and completeness, and I agree with the above. Johney Maine MD

## 2023-06-21 DIAGNOSIS — B309 Viral conjunctivitis, unspecified: Secondary | ICD-10-CM | POA: Diagnosis not present

## 2023-06-21 DIAGNOSIS — H6123 Impacted cerumen, bilateral: Secondary | ICD-10-CM | POA: Diagnosis not present

## 2023-06-21 DIAGNOSIS — J069 Acute upper respiratory infection, unspecified: Secondary | ICD-10-CM | POA: Diagnosis not present

## 2023-07-08 DIAGNOSIS — E0590 Thyrotoxicosis, unspecified without thyrotoxic crisis or storm: Secondary | ICD-10-CM | POA: Diagnosis not present

## 2023-12-22 DIAGNOSIS — G2581 Restless legs syndrome: Secondary | ICD-10-CM | POA: Diagnosis not present

## 2023-12-22 DIAGNOSIS — R251 Tremor, unspecified: Secondary | ICD-10-CM | POA: Diagnosis not present

## 2023-12-22 DIAGNOSIS — R198 Other specified symptoms and signs involving the digestive system and abdomen: Secondary | ICD-10-CM | POA: Diagnosis not present

## 2023-12-22 DIAGNOSIS — G629 Polyneuropathy, unspecified: Secondary | ICD-10-CM | POA: Diagnosis not present

## 2023-12-22 DIAGNOSIS — Z79899 Other long term (current) drug therapy: Secondary | ICD-10-CM | POA: Diagnosis not present

## 2023-12-22 DIAGNOSIS — M722 Plantar fascial fibromatosis: Secondary | ICD-10-CM | POA: Diagnosis not present

## 2023-12-22 DIAGNOSIS — R2681 Unsteadiness on feet: Secondary | ICD-10-CM | POA: Diagnosis not present

## 2023-12-22 DIAGNOSIS — Z1331 Encounter for screening for depression: Secondary | ICD-10-CM | POA: Diagnosis not present

## 2023-12-22 DIAGNOSIS — R600 Localized edema: Secondary | ICD-10-CM | POA: Diagnosis not present

## 2023-12-22 DIAGNOSIS — R809 Proteinuria, unspecified: Secondary | ICD-10-CM | POA: Diagnosis not present

## 2023-12-22 DIAGNOSIS — E039 Hypothyroidism, unspecified: Secondary | ICD-10-CM | POA: Diagnosis not present

## 2023-12-22 DIAGNOSIS — Z23 Encounter for immunization: Secondary | ICD-10-CM | POA: Diagnosis not present

## 2023-12-22 DIAGNOSIS — Z0000 Encounter for general adult medical examination without abnormal findings: Secondary | ICD-10-CM | POA: Diagnosis not present

## 2023-12-22 DIAGNOSIS — C8580 Other specified types of non-Hodgkin lymphoma, unspecified site: Secondary | ICD-10-CM | POA: Diagnosis not present

## 2023-12-22 DIAGNOSIS — E782 Mixed hyperlipidemia: Secondary | ICD-10-CM | POA: Diagnosis not present

## 2023-12-23 ENCOUNTER — Encounter: Payer: Self-pay | Admitting: Internal Medicine

## 2024-06-02 ENCOUNTER — Other Ambulatory Visit

## 2024-06-02 ENCOUNTER — Ambulatory Visit: Admitting: Hematology
